# Patient Record
Sex: Male | Born: 1970 | Race: Black or African American | Hispanic: No | Marital: Married | Smoking: Never smoker
Health system: Southern US, Community
[De-identification: ages and names within clinical notes are randomized; demographics above are authoritative.]

## PROBLEM LIST (undated history)

## (undated) DIAGNOSIS — S86019A Strain of unspecified Achilles tendon, initial encounter: Secondary | ICD-10-CM

## (undated) HISTORY — PX: WISDOM TOOTH EXTRACTION: SHX21

## (undated) HISTORY — PX: FINGER AMPUTATION: SHX636

---

## 2004-08-18 ENCOUNTER — Encounter: Admission: RE | Admit: 2004-08-18 | Discharge: 2004-08-18 | Payer: Self-pay | Admitting: Otolaryngology

## 2006-11-21 ENCOUNTER — Encounter: Admission: RE | Admit: 2006-11-21 | Discharge: 2006-11-21 | Payer: Self-pay | Admitting: Emergency Medicine

## 2008-03-03 ENCOUNTER — Encounter: Admission: RE | Admit: 2008-03-03 | Discharge: 2008-03-03 | Payer: Self-pay | Admitting: Chiropractic Medicine

## 2009-12-09 ENCOUNTER — Ambulatory Visit: Payer: Self-pay | Admitting: Cardiology

## 2009-12-09 DIAGNOSIS — E669 Obesity, unspecified: Secondary | ICD-10-CM

## 2009-12-09 DIAGNOSIS — R9431 Abnormal electrocardiogram [ECG] [EKG]: Secondary | ICD-10-CM

## 2009-12-09 DIAGNOSIS — R002 Palpitations: Secondary | ICD-10-CM | POA: Insufficient documentation

## 2010-05-13 ENCOUNTER — Emergency Department (HOSPITAL_COMMUNITY): Admission: EM | Admit: 2010-05-13 | Discharge: 2010-05-13 | Payer: Self-pay | Admitting: Emergency Medicine

## 2010-10-29 LAB — CONVERTED CEMR LAB
BUN: 13 mg/dL (ref 6–23)
CO2: 31 meq/L (ref 19–32)
Calcium: 9.4 mg/dL (ref 8.4–10.5)
Chloride: 99 meq/L (ref 96–112)
Creatinine, Ser: 1.1 mg/dL (ref 0.4–1.5)
GFR calc non Af Amer: 95.51 mL/min (ref >60)
Glucose, Bld: 78 mg/dL (ref 70–99)
Potassium: 4.5 meq/L (ref 3.5–5.1)
Sodium: 136 meq/L (ref 135–145)
TSH: 1.13 microintl units/mL (ref 0.35–5.50)

## 2010-10-31 NOTE — Assessment & Plan Note (Signed)
Summary: per Dr Dallas Schimke ? percarditis   Visit Type:  Initial Consult Primary Provider:  Sander Nephew, MD  CC:  Abnormal EKG.  History of Present Illness: The patient presents for evaluation of chest discomfort and an abnormal EKG. He has no prior cardiac history. He recently started a vigorous exercise regimen. He was doing yoga yesterday and describes this as a vigorous activity. With this he had a vague sensation in his chest. He felt "jittery" but did not describe palpitations exactly. He didn't feel his heart racing. He felt mildly lightheaded. He felt slightly late he couldn't take a deep breath. He stopped working out in his symptoms seemed to persist though possibly not as severe. He is quite vague about his symptoms. He said today he felt and still feels a similar "jitteriness". He has had no prior cardiac history. Otherwise with recent aerobic activities he's had no symptoms. In particular he does not describe chest pressure, neck or arm discomfort. He hasn't had presyncope or syncope. He doesn't have any resting complaints such as PND or orthopnea. He doesn't have any fevers or chills. There is no positional discomfort.  He did present to urgent care yesterday and had an EKG which suggested some diffuse ST segment elevation. There were no old EKGs for comparison. He was referred here to exclude the possibility of pericarditis.   Current Medications (verified): 1)  Mucinex 600 Mg Xr12h-Tab (Guaifenesin) .... As Needed 2)  Fish Oil   Oil (Fish Oil) .Marland Kitchen.. 1 By Mouth Daily 3)  Multivitamins   Tabs (Multiple Vitamin) .Marland Kitchen.. 1 By Mouth Dialy 4)  Hydromet 5-1.5 Mg/72ml Syrp (Hydrocodone-Homatropine) .... 5ml Q 4-6 Hours 5)  Atrovent 0.06 % Soln (Ipratropium Bromide) .... As Needed 6)  Azithromycin 600 Mg Tabs (Azithromycin) .Marland Kitchen.. 1 By Mouth As Directed 7)  Ibuprofen 200 Mg Tabs (Ibuprofen) .... 3 By Mouth As Directed  Allergies (verified): No Known Drug Allergies  Past History:  Past Medical  History: None  Past Surgical History: None  Family History: There is no history of early heart disease, arrhythmias, cardiomyopathy or sudden cardiac death. His father has hypertension.  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 He is married. He has a 47 year old daughter. He has never smoked cigarettes and drinks alcohol rarely.  Review of Systems       Sore throat, decreased hearting right ear.  Otherwise as stated in the history of present illness and negative for all other systems.  Vital Signs:  Patient profile:   40 year old male Height:      64 inches Weight:      254 pounds BMI:     43.76 Pulse rate:   60 / minute Resp:     18 per minute BP sitting:   119 / 77  (left arm)  Vitals Entered By: Marrion Coy, CNA (December 09, 2009 11:35 AM)  Physical Exam  General:  Well developed, well nourished, in no acute distress. Head:  normocephalic and atraumatic Eyes:  PERRLA/EOM intact; conjunctiva and lids normal. Mouth:  Teeth, gums and palate normal. Oral mucosa normal. Neck:  Neck supple, no JVD. No masses, thyromegaly or abnormal cervical nodes. Chest Wall:  no deformities or breast masses noted Lungs:  Clear bilaterally to auscultation and percussion. Abdomen:  Bowel sounds positive; abdomen soft and non-tender without masses, organomegaly, or hernias noted. No hepatosplenomegaly, obese Msk:  Back normal, normal gait. Muscle strength and tone normal. Extremities:  No clubbing or cyanosis. Neurologic:  Alert and  oriented x 3. Skin:  Intact without lesions or rashes. Cervical Nodes:  no significant adenopathy Axillary Nodes:  no significant adenopathy Inguinal Nodes:  no significant adenopathy Psych:  Normal affect.   Detailed Cardiovascular Exam  Neck    Carotids: Carotids full and equal bilaterally without bruits.      Neck Veins: Normal, no JVD.    Heart    Inspection: no deformities or lifts noted.      Palpation: normal PMI with no thrills palpable.       Auscultation: regular rate and rhythm, S1, S2 without murmurs, rubs, gallops, or clicks.    Vascular    Abdominal Aorta: no palpable masses, pulsations, or audible bruits.      Femoral Pulses: normal femoral pulses bilaterally.      Pedal Pulses: normal pedal pulses bilaterally.      Radial Pulses: normal radial pulses bilaterally.      Peripheral Circulation: no clubbing, cyanosis, or edema noted with normal capillary refill.     EKG  Procedure date:  12/09/2009  Findings:      sinus rhythm, rate 56, axis within normal limits, intervals within normal limits, early repolarization changes.  Impression & Recommendations:  Problem # 1:  ABNORMAL ELECTROCARDIOGRAM (ICD-794.31) The patient has an EKG consistent with repolarization changes. He has no history to suggest pericarditis. There are no findings on physical exam to suggest this diagnosis. At this point I would suggest no further imaging or other evaluation except as described below. Orders: EKG w/ Interpretation (93000) TLB-BMP (Basic Metabolic Panel-BMET) (80048-METABOL) TLB-TSH (Thyroid Stimulating Hormone) (84443-TSH)  Problem # 2:  PALPITATIONS (ICD-785.1) His symptoms are somewhat vague. However, he seems to describe palpitations. However, he says he's feeling them even now with a normal EKG. I have encouraged him to go back to exercising. If he has any exacerbation of symptoms similar to yesterday I would proceed with event monitoring though I think this is somewhat low yield. I will go ahead today and check a TSH.  Problem # 3:  OBESITY, UNSPECIFIED (ICD-278.00) The patient has been exercising and I applaud this and encourage continued dieting and exercise.  Patient Instructions: 1)  Your physician recommends that you schedule a follow-up appointment as directed 2)  Your physician recommends that you continue on your current medications as directed. Please refer to the Current Medication list given to you today. 3)   Your physician recommends that you have lab work today: bmp and tsh  794.31

## 2010-12-15 LAB — POCT I-STAT, CHEM 8
BUN: 14 mg/dL (ref 6–23)
Calcium, Ion: 1.16 mmol/L (ref 1.12–1.32)
Chloride: 100 mEq/L (ref 96–112)
Creatinine, Ser: 1.4 mg/dL (ref 0.4–1.5)
Glucose, Bld: 97 mg/dL (ref 70–99)
HCT: 45 % (ref 39.0–52.0)
Hemoglobin: 15.3 g/dL (ref 13.0–17.0)
Potassium: 4.1 mEq/L (ref 3.5–5.1)
Sodium: 138 mEq/L (ref 135–145)
TCO2: 31 mmol/L (ref 0–100)

## 2011-04-05 ENCOUNTER — Encounter: Payer: Self-pay | Admitting: Cardiology

## 2012-04-17 ENCOUNTER — Ambulatory Visit (INDEPENDENT_AMBULATORY_CARE_PROVIDER_SITE_OTHER): Payer: BC Managed Care – PPO | Admitting: Emergency Medicine

## 2012-04-17 ENCOUNTER — Ambulatory Visit: Payer: BC Managed Care – PPO

## 2012-04-17 VITALS — BP 111/71 | HR 84 | Temp 99.2°F | Resp 18 | Ht 68.5 in | Wt 262.0 lb

## 2012-04-17 DIAGNOSIS — S335XXA Sprain of ligaments of lumbar spine, initial encounter: Secondary | ICD-10-CM

## 2012-04-17 DIAGNOSIS — E663 Overweight: Secondary | ICD-10-CM

## 2012-04-17 DIAGNOSIS — S39012A Strain of muscle, fascia and tendon of lower back, initial encounter: Secondary | ICD-10-CM

## 2012-04-17 DIAGNOSIS — M545 Low back pain: Secondary | ICD-10-CM

## 2012-04-17 NOTE — Progress Notes (Signed)
  Subjective:    Patient ID: Alan Sanchez, male    DOB: 09/03/1971, 41 y.o.   MRN: [id number]  HPI  Pt presents to clinic with chronic intermittent lumbar pain.  It started in high school without an injury and has continued.  Several years ago he had gained some extra weight and was experiencing increased pain so he loss some weight and went to chiropractor and felt better.  Then recently he started to experience the pain again.  It seems to be with walking and the distance he can walk without pain has decreased.  He rarely has pain with other movements and with sitting.  He has had no known injury.  He was in [place] last week and he had the worst of his pain.  He was evaluated and given muscle relaxer but only took 1 pill because it did not seem to help that much.  He has no pain radiation, weakness or paresthesias.  Pain feels tight.  Pt uses Aleve at times with some relief.  Review of Systems  Musculoskeletal: Positive for back pain. Negative for myalgias, joint swelling and gait problem.       Objective:   Physical Exam  Vitals reviewed. Constitutional: He is oriented to person, place, and time. He appears well-developed and well-nourished.  HENT:  Head: Normocephalic and atraumatic.  Right Ear: External ear normal.  Left Ear: External ear normal.  Nose: Nose normal.  Eyes: Conjunctivae are normal.  Neck: Normal range of motion.  Pulmonary/Chest: Effort normal.  Musculoskeletal: He exhibits no tenderness.       Lumbar back: He exhibits normal range of motion, no tenderness, no bony tenderness and no swelling.       Back:  Neurological: He is alert and oriented to person, place, and time.  Skin: Skin is warm and dry.  Psychiatric: He has a normal mood and affect. His behavior is normal. Judgment and thought content normal.   [facility] reading (PRIMARY) by  Dr. Cleta Alberts. Neg.     Assessment & Plan:   1. Lumbar back pain  DG Lumbar Spine 2-3 Views  2. Overweight     Believe his pain is  related to his lack of core muscles and strain.  D/w pt that weight loss and core exercises will help to decrease strain and imprpve support for lumbar spine.  He can continue to use Aleve OTC prn for his pain.  If pt is interested d/w him possible PT referral for back strengthening.  Sent pt home with lumbar exercises that focus on improving core strength.

## 2012-07-08 ENCOUNTER — Ambulatory Visit (INDEPENDENT_AMBULATORY_CARE_PROVIDER_SITE_OTHER): Payer: BC Managed Care – PPO | Admitting: Internal Medicine

## 2012-07-08 VITALS — BP 124/86 | HR 68 | Temp 98.1°F | Resp 18 | Ht 68.75 in | Wt 255.4 lb

## 2012-07-08 DIAGNOSIS — Z0000 Encounter for general adult medical examination without abnormal findings: Secondary | ICD-10-CM

## 2012-07-08 DIAGNOSIS — Z7189 Other specified counseling: Secondary | ICD-10-CM

## 2012-07-08 DIAGNOSIS — E663 Overweight: Secondary | ICD-10-CM

## 2012-07-08 DIAGNOSIS — Z23 Encounter for immunization: Secondary | ICD-10-CM

## 2012-07-08 LAB — POCT UA - MICROSCOPIC ONLY
Bacteria, U Microscopic: NEGATIVE
Casts, Ur, LPF, POC: NEGATIVE
Epithelial cells, urine per micros: NEGATIVE
Mucus, UA: NEGATIVE
RBC, urine, microscopic: NEGATIVE
WBC, Ur, HPF, POC: NEGATIVE
Yeast, UA: NEGATIVE

## 2012-07-08 LAB — POCT URINALYSIS DIPSTICK
Bilirubin, UA: NEGATIVE
Glucose, UA: NEGATIVE
Ketones, UA: NEGATIVE
Leukocytes, UA: NEGATIVE
Nitrite, UA: NEGATIVE
Protein, UA: NEGATIVE
Spec Grav, UA: 1.02
pH, UA: 5

## 2012-07-08 LAB — URIC ACID: Uric Acid, Serum: 6.2 mg/dL (ref 4.0–7.8)

## 2012-07-08 LAB — COMPREHENSIVE METABOLIC PANEL
ALT: 24 U/L (ref 0–53)
AST: 26 U/L (ref 0–37)
Albumin: 4.6 g/dL (ref 3.5–5.2)
BUN: 16 mg/dL (ref 6–23)
Calcium: 9.7 mg/dL (ref 8.4–10.5)
Chloride: 101 mEq/L (ref 96–112)
Creat: 1.35 mg/dL (ref 0.50–1.35)
Glucose, Bld: 79 mg/dL (ref 70–99)
Total Bilirubin: 1 mg/dL (ref 0.3–1.2)
Total Protein: 7.8 g/dL (ref 6.0–8.3)

## 2012-07-08 LAB — POCT CBC
Granulocyte percent: 54.6 %G (ref 37–80)
HCT, POC: 49.9 % (ref 43.5–53.7)
Hemoglobin: 16 g/dL (ref 14.1–18.1)
Lymph, poc: 2.3 (ref 0.6–3.4)
MID (cbc): 0.7 (ref 0–0.9)
MPV: 9.7 fL (ref 0–99.8)
POC Granulocyte: 3.6 (ref 2–6.9)
POC MID %: 9.9 %M (ref 0–12)
RBC: 5.76 M/uL (ref 4.69–6.13)
RDW, POC: 13.8 %
WBC: 6.6 10*3/uL (ref 4.6–10.2)

## 2012-07-08 LAB — LIPID PANEL
Cholesterol: 232 mg/dL — ABNORMAL HIGH (ref 0–200)
HDL: 49 mg/dL (ref >39)
LDL Cholesterol: 160 mg/dL — ABNORMAL HIGH (ref 0–99)
Total CHOL/HDL Ratio: 4.7 Ratio
VLDL: 23 mg/dL (ref 0–40)

## 2012-07-08 LAB — POCT GLYCOSYLATED HEMOGLOBIN (HGB A1C): Hemoglobin A1C: 5.4

## 2012-07-08 LAB — GLUCOSE, POCT (MANUAL RESULT ENTRY): POC Glucose: 78 mg/dl (ref 70–99)

## 2012-07-08 NOTE — Patient Instructions (Signed)

## 2012-07-08 NOTE — Progress Notes (Signed)
  Subjective:    Patient ID: Khyson Sebesta, male    DOB: 1970-11-16, 41 y.o.   MRN: [id number]  HPI Healthy but overweight See scanned hx   Review of Systems See scanned ros    Objective:   Physical Exam  Constitutional: He is oriented to person, place, and time. He appears well-developed and well-nourished.  HENT:  Right Ear: External ear normal.  Left Ear: External ear normal.  Nose: Nose normal.  Mouth/Throat: Oropharynx is clear and moist.  Eyes: EOM are normal. Pupils are equal, round, and reactive to light.  Neck: Normal range of motion. Neck supple. No thyromegaly present.  Cardiovascular: Normal rate, regular rhythm and normal heart sounds.   Pulmonary/Chest: Effort normal and breath sounds normal.  Abdominal: Soft. Bowel sounds are normal.  Genitourinary: Rectum normal, prostate normal and penis normal.  Musculoskeletal: Normal range of motion.  Lymphadenopathy:    He has no cervical adenopathy.  Neurological: He is alert and oriented to person, place, and time. He has normal reflexes. No cranial nerve deficit. He exhibits normal muscle tone. Coordination normal.  Skin: Skin is warm and dry.  Psychiatric: He has a normal mood and affect. His behavior is normal. Judgment and thought content normal.   Results for orders placed in visit on 07/08/12  POCT CBC      Component Value Range   WBC 6.6  4.6 - 10.2 K/uL   Lymph, poc 2.3  0.6 - 3.4   POC LYMPH PERCENT 35.5  10 - 50 %L   MID (cbc) 0.7  0 - 0.9   POC MID % 9.9  0 - 12 %M   POC Granulocyte 3.6  2 - 6.9   Granulocyte percent 54.6  37 - 80 %G   RBC 5.76  4.69 - 6.13 M/uL   Hemoglobin 16.0  14.1 - 18.1 g/dL   HCT, POC 78.2  95.6 - 53.7 %   MCV 86.7  80 - 97 fL   MCH, POC 27.8  27 - 31.2 pg   MCHC 32.1  31.8 - 35.4 g/dL   RDW, POC 21.3     Platelet Count, POC 267  142 - 424 K/uL   MPV 9.7  0 - 99.8 fL  POCT UA - MICROSCOPIC ONLY      Component Value Range   WBC, Ur, HPF, POC neg     RBC, urine, microscopic  neg     Bacteria, U Microscopic neg     Mucus, UA neg     Epithelial cells, urine per micros neg     Crystals, Ur, HPF, POC + uric acid crystals     Casts, Ur, LPF, POC neg     Yeast, UA neg    POCT URINALYSIS DIPSTICK      Component Value Range   Color, UA yellow     Clarity, UA clear     Glucose, UA neg     Bilirubin, UA neg     Ketones, UA neg     Spec Grav, UA 1.020     Blood, UA neg     pH, UA 5.0     Protein, UA neg     Urobilinogen, UA 0.2     Nitrite, UA neg     Leukocytes, UA Negative    IFOBT (OCCULT BLOOD)      Component Value Range   IFOBT Negative            Assessment & Plan:  Healthy CPE

## 2012-07-09 LAB — PSA: PSA: 0.95 ng/mL (ref <=4.00)

## 2012-07-11 ENCOUNTER — Encounter: Payer: Self-pay | Admitting: *Deleted

## 2012-07-23 ENCOUNTER — Encounter: Payer: Self-pay | Admitting: Internal Medicine

## 2013-08-08 ENCOUNTER — Ambulatory Visit (INDEPENDENT_AMBULATORY_CARE_PROVIDER_SITE_OTHER): Payer: BC Managed Care – PPO | Admitting: Family Medicine

## 2013-08-08 VITALS — BP 133/82 | HR 67 | Temp 98.3°F | Resp 18 | Ht 69.0 in | Wt 261.0 lb

## 2013-08-08 DIAGNOSIS — D509 Iron deficiency anemia, unspecified: Secondary | ICD-10-CM

## 2013-08-08 DIAGNOSIS — R42 Dizziness and giddiness: Secondary | ICD-10-CM

## 2013-08-08 DIAGNOSIS — K921 Melena: Secondary | ICD-10-CM

## 2013-08-08 LAB — POCT URINALYSIS DIPSTICK
Bilirubin, UA: NEGATIVE
Blood, UA: NEGATIVE
Glucose, UA: NEGATIVE
Ketones, UA: NEGATIVE
Leukocytes, UA: NEGATIVE
Spec Grav, UA: 1.02
Urobilinogen, UA: 0.2

## 2013-08-08 LAB — IFOBT (OCCULT BLOOD): IFOBT: NEGATIVE

## 2013-08-08 LAB — COMPREHENSIVE METABOLIC PANEL
ALT: 38 U/L (ref 0–53)
AST: 32 U/L (ref 0–37)
Albumin: 4.3 g/dL (ref 3.5–5.2)
Alkaline Phosphatase: 87 U/L (ref 39–117)
BUN: 19 mg/dL (ref 6–23)
CO2: 32 mEq/L (ref 19–32)
Glucose, Bld: 82 mg/dL (ref 70–99)
Sodium: 136 mEq/L (ref 135–145)
Total Bilirubin: 0.8 mg/dL (ref 0.3–1.2)
Total Protein: 7.5 g/dL (ref 6.0–8.3)

## 2013-08-08 LAB — POCT CBC
Granulocyte percent: 54.5 %G (ref 37–80)
HCT, POC: 46.2 % (ref 43.5–53.7)
Hemoglobin: 14.2 g/dL (ref 14.1–18.1)
Lymph, poc: 2.1 (ref 0.6–3.4)
MCH, POC: 27 pg (ref 27–31.2)
MCHC: 30.7 g/dL — AB (ref 31.8–35.4)
MCV: 88 fL (ref 80–97)
MID (cbc): 0.5 (ref 0–0.9)
MPV: 9.6 fL (ref 0–99.8)
POC LYMPH PERCENT: 37 %L (ref 10–50)
POC MID %: 8.5 %M (ref 0–12)
Platelet Count, POC: 238 10*3/uL (ref 142–424)
RDW, POC: 13 %
WBC: 5.8 10*3/uL (ref 4.6–10.2)

## 2013-08-08 LAB — TSH: TSH: 1.095 u[IU]/mL (ref 0.350–4.500)

## 2013-08-08 LAB — POCT GLYCOSYLATED HEMOGLOBIN (HGB A1C): Hemoglobin A1C: 5.3

## 2013-08-08 LAB — POCT UA - MICROSCOPIC ONLY
Bacteria, U Microscopic: NEGATIVE
Crystals, Ur, HPF, POC: NEGATIVE
Epithelial cells, urine per micros: NEGATIVE
Mucus, UA: NEGATIVE
RBC, urine, microscopic: NEGATIVE
Yeast, UA: NEGATIVE

## 2013-08-08 LAB — GLUCOSE, POCT (MANUAL RESULT ENTRY): POC Glucose: 63 mg/dl — AB (ref 70–99)

## 2013-08-08 LAB — POCT SEDIMENTATION RATE: POCT SED RATE: 13 mm/hr (ref 0–22)

## 2013-08-08 MED ORDER — CETIRIZINE HCL 10 MG PO TABS: 10.0000 mg | ORAL_TABLET | Freq: Every day | ORAL | Status: DC

## 2013-08-08 NOTE — Progress Notes (Addendum)
This chart was scribed for Alan Sorenson, MD by Alan Sanchez, Scribe. This patient was seen in room 1 and the patient's care was started at 11:48 AM.  Subjective:    Patient ID: Alan Sanchez, male    DOB: 1971-07-11, 42 y.o.   MRN: [id number]  Chief Complaint  Patient presents with  . Dizziness    x2 weeks, lightheadedness when standing today its worse     HPI  HPI Comments: Alan Sanchez is a 42 y.o. male who presents to [facility] complaining of feeling light-headed intermiittently over the past 2 weeks. He describes this sensation as "feeling off-balance". He denies vertigo-type sensation. He states that the light-headed sensation is occuring first thing in the morning when he stands up after getting out of bed. It only lasts for a few seconds, <1 min. However, it has not gone away over the past 2 wks and today the sensation stayed with him for the first time - he is still feeling a little bit of it now in the office if he moves or turns his head to quickly.   He also states that he noticed, upon wiping this morning, that his stool was dark black. He states that he has not previously noticed this symptom. He states that he has a history of anemia "a long time ago" and was never placed on any medications for this.  He denies chest pain or palpitations. He denies recent colds, sinus congestion, ear infections or any other recent illnesses. He denies nausea, emesis, abdominal pain, diarrhea, constipation, dysuria, hematuria, urgency and frequency.   Past Medical History  Diagnosis Date  . Anemia    Current Outpatient Prescriptions on File Prior to Visit  Medication Sig Dispense Refill  . Ascorbic Acid (VITAMIN C) 100 MG tablet Take 100 mg by mouth daily.      . fish oil-omega-3 fatty acids 1000 MG capsule Take 1 g by mouth daily.        . multivitamin (THERAGRAN) per tablet Take 1 tablet by mouth daily.        . vitamin B-12 (CYANOCOBALAMIN) 100 MCG tablet Take 50 mcg  by mouth daily.      . cyclobenzaprine (FLEXERIL) 10 MG tablet Take 10 mg by mouth 3 (three) times daily as needed.      Marland Kitchen ibuprofen (ADVIL,MOTRIN) 200 MG tablet Take 600 mg by mouth as directed.         No current facility-administered medications on file prior to visit.   Allergies  Allergen Reactions  . Mold Extract [Trichophyton]     Review of Systems  Constitutional: Negative for fever, chills, activity change and appetite change.  HENT: Negative for congestion, ear pain, postnasal drip, rhinorrhea, sinus pressure, sneezing, sore throat and trouble swallowing.   Respiratory: Negative for cough, shortness of breath and wheezing.   Cardiovascular: Negative for chest pain and palpitations.  Gastrointestinal: Positive for blood in stool (noticed "dark black" while wiping this morning). Negative for nausea, vomiting, abdominal pain, diarrhea and constipation.  Genitourinary: Negative for dysuria, urgency, frequency, hematuria and decreased urine volume.  Skin: Negative for rash.  Neurological: Positive for light-headedness. Negative for syncope, weakness, numbness and headaches.  Psychiatric/Behavioral: Negative for confusion and sleep disturbance.      BP 122/70  Pulse 69  Temp(Src) 98.3 F (36.8 C) (Oral)  Resp 18  Ht 5\' 9"  (1.753 m)  Wt 261 lb (118.389 kg)  BMI 38.53 kg/m2  SpO2 100% Objective:   Physical  Exam  Nursing note and vitals reviewed. Constitutional: He is oriented to person, place, and time. He appears well-developed and well-nourished. No distress.  HENT:  Head: Normocephalic and atraumatic.  Right Ear: Tympanic membrane is not injected, not erythematous, not retracted and not bulging. A middle ear effusion (mild) is present.  Left Ear: Tympanic membrane is not injected, not erythematous, not retracted and not bulging. A middle ear effusion (mild) is present.  Nose: Rhinorrhea present. No mucosal edema.  Mouth/Throat: Uvula is midline, oropharynx is clear and  moist and mucous membranes are normal. No oropharyngeal exudate, posterior oropharyngeal edema or posterior oropharyngeal erythema.  Eyes: EOM are normal.  Neck: Neck supple. No tracheal deviation present. No mass and no thyromegaly present.  Cardiovascular: Normal rate, regular rhythm and normal heart sounds.   No murmur heard. Pulmonary/Chest: Effort normal and breath sounds normal. No respiratory distress. He has no wheezes. He has no rales.  Genitourinary: Rectum normal and prostate normal. Rectal exam shows no external hemorrhoid, no internal hemorrhoid, no fissure, no mass, no tenderness and anal tone normal. Guaiac negative stool. Prostate is not enlarged and not tender.  Musculoskeletal: Normal range of motion.  Lymphadenopathy:       Head (right side): No submandibular, no tonsillar, no preauricular and no posterior auricular adenopathy present.       Head (left side): No submandibular, no tonsillar, no preauricular and no posterior auricular adenopathy present.    He has no cervical adenopathy.       Right cervical: No superficial cervical and no posterior cervical adenopathy present.      Left cervical: No posterior cervical adenopathy present.       Right: No supraclavicular adenopathy present.       Left: No supraclavicular adenopathy present.  Neurological: He is alert and oriented to person, place, and time.  Skin: Skin is warm and dry.  Psychiatric: He has a normal mood and affect. His behavior is normal.   EKG: NSR, no significant changes from prior [facility] negative.    Results for orders placed in visit on 08/08/13  POCT CBC      Result Value Range   WBC 5.8  4.6 - 10.2 K/uL   Lymph, poc 2.1  0.6 - 3.4   POC LYMPH PERCENT 37.0  10 - 50 %L   MID (cbc) 0.5  0 - 0.9   POC MID % 8.5  0 - 12 %M   POC Granulocyte 3.2  2 - 6.9   Granulocyte percent 54.5  37 - 80 %G   RBC 5.25  4.69 - 6.13 M/uL   Hemoglobin 14.2  14.1 - 18.1 g/dL   HCT, POC 16.1  09.6 - 53.7 %   MCV  88.0  80 - 97 fL   MCH, POC 27.0  27 - 31.2 pg   MCHC 30.7 (*) 31.8 - 35.4 g/dL   RDW, POC 04.5     Platelet Count, POC 238  142 - 424 K/uL   MPV 9.6  0 - 99.8 fL  GLUCOSE, POCT (MANUAL RESULT ENTRY)      Result Value Range   POC Glucose 63 (*) 70 - 99 mg/dl  IFOBT (OCCULT BLOOD)      Result Value Range   IFOBT Negative    POCT URINALYSIS DIPSTICK      Result Value Range   Color, UA yellow     Clarity, UA clear     Glucose, UA neg     Bilirubin, UA  neg     Ketones, UA neg     Spec Grav, UA 1.020     Blood, UA neg     pH, UA 5.5     Protein, UA neg     Urobilinogen, UA 0.2     Nitrite, UA neg     Leukocytes, UA Negative    POCT UA - MICROSCOPIC ONLY      Result Value Range   WBC, Ur, HPF, POC 0-1     RBC, urine, microscopic neg     Bacteria, U Microscopic neg     Mucus, UA neg     Epithelial cells, urine per micros neg     Crystals, Ur, HPF, POC neg     Casts, Ur, LPF, POC neg     Yeast, UA neg    POCT GLYCOSYLATED HEMOGLOBIN (HGB A1C)      Result Value Range   Hemoglobin A1C 5.3     Assessment & Plan:  Orthostatic lightheadedness - Plan: POCT CBC, POCT glucose (manual entry), IFOBT POC (occult bld, rslt in office), POCT SEDIMENTATION RATE, POCT urinalysis dipstick, POCT UA - Microscopic Only, Comprehensive metabolic panel, TSH, EKG 12-Lead, POCT glycosylated hemoglobin (Hb A1C)  Melena - Plan: POCT CBC, POCT glucose (manual entry), IFOBT POC (occult bld, rslt in office), POCT SEDIMENTATION RATE, POCT urinalysis dipstick, POCT UA - Microscopic Only, Comprehensive metabolic panel, TSH, EKG 12-Lead - reassuring negative exam today - cont to monitor stools.  Anemia, iron deficiency - Plan: POCT CBC, POCT glucose (manual entry), IFOBT POC (occult bld, rslt in office), POCT SEDIMENTATION RATE, POCT urinalysis dipstick, POCT UA - Microscopic Only, Comprehensive metabolic panel, TSH, EKG 12-Lead - Unknown etiology of this in distant past - seems to have completely  resolved.  Unknown etiology of sxs. See pt instructions - will try qhs cetirizine to ensure no complicating middle ear effusion or ETD as cause, have pt eat and drink something in morning to ensure no dehydration/hypoglycemia as cause.  Cont to monitor sxs closely - watch for daytime sxs if he lays downs/naps during the day. If continues, RTC for further eval. Meds ordered this encounter  Medications  . cetirizine (ZYRTEC) 10 MG tablet    Sig: Take 1 tablet (10 mg total) by mouth at bedtime.    Dispense:  30 tablet    Refill:  1    I personally performed the services described in this documentation, which was scribed in my presence. The recorded information has been reviewed and considered, and addended by me as needed.  Alan Sorenson, MD MPH

## 2013-08-08 NOTE — Patient Instructions (Addendum)
Please keep a snack - a banana and some crackers and some water - or maybe some orange juice or gatorade next to your bed. Try having a little something to eat and or drink before getting up in the morning.  Also, lets have you start taking the cetrizine right before bed to ensure you are not having any allergy symptoms.  Hopefully this will resolve with these easy measures while we are obtaining the rest of your labs.  If this continues or worsens and these measures do not help, please come back into clinic for repeat evaluation and we can consider whether you would benefit from specialist referral at that time.  Orthostatic Hypotension Orthostatic hypotension is a sudden fall in blood pressure. It occurs when a person goes from a sitting or lying position to a standing position. CAUSES   Loss of body fluids (dehydration).  Medicines that lower blood pressure.  Sudden changes in posture, such as sudden standing when you have been sitting or lying down.  Taking too much of your medicine. SYMPTOMS   Lightheadedness or dizziness.  Fainting or near-fainting.  A fast heart rate (tachycardia).  Weakness.  Feeling tired (fatigue). DIAGNOSIS  Your caregiver may find the cause of orthostatic hypotension through:  A history and/or physical exam.  Checking your blood pressure. Your caregiver will check your blood pressure when you are:  Lying down.  Sitting.  Standing.  Tilt table testing. In this test, you are placed on a table that goes from a lying position to a standing position. You will be strapped to the table. This test helps to monitor your blood pressure and heart rate when you are in different positions. TREATMENT   If orthostatic hypotension is caused by your medicines, your caregiver will need to adjust your dosage. Do not stop or adjust your medicine on your own.  When changing positions, make these changes slowly. This allows your body to adjust to the different  position.  Compression stockings that are worn on your lower legs may be helpful.  Your caregiver may have you consume extra salt. Do not add extra salt to your diet unless directed by your caregiver.  Eat frequent, small meals. Avoid sudden standing after eating.  Avoid hot showers or excessive heat.  Your caregiver may give you fluids through the vein (intravenous).  Your caregiver may put you on medicine to help enhance fluid retention. SEEK IMMEDIATE MEDICAL CARE IF:   You faint or have a near-fainting episode. Call your local emergency services (911 in [place]  You have or develop chest pain.  You feel sick to your stomach (nauseous) or vomit.  You have a loss of feeling or movement in your arms or legs.  You have difficulty talking, slurred speech, or you are unable to talk.  You have difficulty thinking or have confused thinking. MAKE SURE YOU:   Understand these instructions.  Will watch your condition.  Will get help right away if you are not doing well or get worse. Document Released: 09/07/2002 Document Revised: 12/10/2011 Document Reviewed: 12/31/2008 [facility] Patient Information 2014 [place], [place]. Near-Syncope Near-syncope (commonly known as near fainting) is sudden weakness, dizziness, or feeling like you might pass out. During an episode of near-syncope, you may also develop pale skin, have tunnel vision, or feel sick to your stomach (nauseous). Near-syncope may occur when getting up after sitting or while standing for a long time. It is caused by a sudden decrease in blood flow to the brain. This decrease  can result from various causes or triggers, most of which are not serious. However, because near-syncope can sometimes be a sign of something serious, a medical evaluation is required. The specific cause is often not determined. HOME CARE INSTRUCTIONS  Monitor your condition for any changes. The following actions may help to alleviate any discomfort you are  experiencing:  Have someone stay with you until you feel stable.  Lie down right away if you start feeling like you might faint. Breathe deeply and steadily. Wait until all the symptoms have passed. Most of these episodes last only a few minutes. You may feel tired for several hours.   Drink enough fluids to keep your urine clear or pale yellow.   If you are taking blood pressure or heart medicine, get up slowly when seated or lying down. Take several minutes to sit and then stand. This can reduce dizziness.  Follow up with your health care provider as directed. SEEK IMMEDIATE MEDICAL CARE IF:   You have a severe headache.   You have unusual pain in the chest, abdomen, or back.   You are bleeding from the mouth or rectum, or you have black or tarry stool.   You have an irregular or very fast heartbeat.   You have repeated fainting or have seizure-like jerking during an episode.   You faint when sitting or lying down.   You have confusion.   You have difficulty walking.   You have severe weakness.   You have vision problems.  MAKE SURE YOU:   Understand these instructions.  Will watch your condition.  Will get help right away if you are not doing well or get worse. Document Released: 09/17/2005 Document Revised: 05/20/2013 Document Reviewed: 02/20/2013 [facility] Patient Information 2014 [place], [place].

## 2013-08-10 ENCOUNTER — Encounter: Payer: Self-pay | Admitting: Family Medicine

## 2013-08-11 ENCOUNTER — Encounter: Payer: Self-pay | Admitting: Family Medicine

## 2013-11-15 ENCOUNTER — Other Ambulatory Visit: Payer: Self-pay | Admitting: Emergency Medicine

## 2013-11-15 ENCOUNTER — Ambulatory Visit: Payer: BC Managed Care – PPO

## 2013-11-15 ENCOUNTER — Ambulatory Visit (INDEPENDENT_AMBULATORY_CARE_PROVIDER_SITE_OTHER): Payer: BC Managed Care – PPO | Admitting: Emergency Medicine

## 2013-11-15 VITALS — BP 120/84 | HR 73 | Temp 98.9°F | Resp 16 | Ht 68.5 in | Wt 263.5 lb

## 2013-11-15 DIAGNOSIS — M79641 Pain in right hand: Secondary | ICD-10-CM

## 2013-11-15 DIAGNOSIS — M79609 Pain in unspecified limb: Secondary | ICD-10-CM

## 2013-11-15 DIAGNOSIS — S60229A Contusion of unspecified hand, initial encounter: Secondary | ICD-10-CM

## 2013-11-15 MED ORDER — NAPROXEN SODIUM 550 MG PO TABS: 550.0000 mg | ORAL_TABLET | Freq: Two times a day (BID) | ORAL | Status: DC

## 2013-11-15 NOTE — Progress Notes (Signed)
[facility] [address] [place] [address] [phone]  Date:  11/15/2013   Name:  Alan Sanchez   DOB:  [birth year]   MRN:  [id number]  PCP:  No PCP Per Patient    Chief Complaint: Hand Pain   History of Present Illness:  Alan Sanchez is a 43 y.o. very pleasant male patient who presents with the following:  Injured Monday working out while doing "squat thrusts" came down on his right closed fist and felt immediate pain in his right third MCP joint.  No swelling, crepitus or bruising.  No improvement with over the counter medications or other home remedies. Denies other complaint or health concern today.   Patient Active Problem List   Diagnosis Date Noted  . OBESITY, UNSPECIFIED 12/09/2009  . PALPITATIONS 12/09/2009  . ABNORMAL ELECTROCARDIOGRAM 12/09/2009    Past Medical History  Diagnosis Date  . Anemia     History reviewed. No pertinent past surgical history.  History  Substance Use Topics  . Smoking status: Never Smoker   . Smokeless tobacco: Not on file  . Alcohol Use: Yes     Comment: Rarely    Family History  Problem Relation Age of Onset  . Hypertension Father   . Arrhythmia Neg Hx   . Heart disease Neg Hx     No early  . Cardiomyopathy Neg Hx     Nor sudden cardiac death  . Hypertension Brother     Allergies  Allergen Reactions  . Mold Extract [Trichophyton]     Medication list has been reviewed and updated.  Current Outpatient Prescriptions on File Prior to Visit  Medication Sig Dispense Refill  . Ascorbic Acid (VITAMIN C) 100 MG tablet Take 100 mg by mouth daily.      . cetirizine (ZYRTEC) 10 MG tablet Take 1 tablet (10 mg total) by mouth at bedtime.  30 tablet  1  . cyclobenzaprine (FLEXERIL) 10 MG tablet Take 10 mg by mouth 3 (three) times daily as needed.      . fish oil-omega-3 fatty acids 1000 MG capsule Take 1 g by mouth daily.        Marland Kitchen ibuprofen (ADVIL,MOTRIN) 200 MG tablet Take 600 mg by mouth as  directed.        . multivitamin (THERAGRAN) per tablet Take 1 tablet by mouth daily.        . vitamin B-12 (CYANOCOBALAMIN) 100 MCG tablet Take 50 mcg by mouth daily.       No current facility-administered medications on file prior to visit.    Review of Systems:  As per HPI, otherwise negative.    Physical Examination: Filed Vitals:   11/15/13 1354  BP: 120/84  Pulse: 73  Temp: 98.9 F (37.2 C)  Resp: 16   Filed Vitals:   11/15/13 1354  Height: 5' 8.5" (1.74 m)  Weight: 263 lb 8 oz (119.523 kg)   Body mass index is 39.48 kg/(m^2). Ideal Body Weight: Weight in (lb) to have BMI = 25: 166.5   GEN: WDWN, NAD, Non-toxic, Alert & Oriented x 3 HEENT: Atraumatic, Normocephalic.  Ears and Nose: No external deformity. EXTR: No clubbing/cyanosis/edema NEURO: Normal gait.  PSYCH: Normally interactive. Conversant. Not depressed or anxious appearing.  Calm demeanor.  Right Hand:  Tender over third finger MCP.  No deformity or ecchymosis.  Full AROM.    Assessment and Plan: Contusion hand Anaprox  Signed,  Phillips Odor, MD   [facility] reading (PRIMARY) by  Dr. Dareen PianoAnderson.  Negative hand .

## 2013-11-15 NOTE — Patient Instructions (Signed)
Hand Contusion °A hand contusion is a deep bruise on your hand area. Contusions are the result of an injury that caused bleeding under the skin. The contusion may turn blue, purple, or yellow. Minor injuries will give you a painless contusion, but more severe contusions may stay painful and swollen for a few weeks. °CAUSES  °A contusion is usually caused by a blow, trauma, or direct force to an area of the body. °SYMPTOMS  °· Swelling and redness of the injured area. °· Discoloration of the injured area. °· Tenderness and soreness of the injured area. °· Pain. °DIAGNOSIS  °The diagnosis can be made by taking a history and performing a physical exam. An X-ray, CT scan, or MRI may be needed to determine if there were any associated injuries, such as broken bones (fractures). °TREATMENT  °Often, the best treatment for a hand contusion is resting, elevating, icing, and applying cold compresses to the injured area. Over-the-counter medicines may also be recommended for pain control. °HOME CARE INSTRUCTIONS  °· Put ice on the injured area. °· Put ice in a plastic bag. °· Place a towel between your skin and the bag. °· Leave the ice on for 15-20 minutes, 03-04 times a day. °· Only take over-the-counter or prescription medicines as directed by your caregiver. Your caregiver may recommend avoiding anti-inflammatory medicines (aspirin, ibuprofen, and naproxen) for 48 hours because these medicines may increase bruising. °· If told, use an elastic wrap as directed. This can help reduce swelling. You may remove the wrap for sleeping, showering, and bathing. If your fingers become numb, cold, or blue, take the wrap off and reapply it more loosely. °· Elevate your hand with pillows to reduce swelling. °· Avoid overusing your hand if it is painful. °SEEK IMMEDIATE MEDICAL CARE IF:  °· You have increased redness, swelling, or pain in your hand. °· Your swelling or pain is not relieved with medicines. °· You have loss of feeling in  your hand or are unable to move your fingers. °· Your hand turns cold or blue. °· You have pain when you move your fingers. °· Your hand becomes warm to the touch. °· Your contusion does not improve in 2 days. °MAKE SURE YOU:  °· Understand these instructions. °· Will watch your condition. °· Will get help right away if you are not doing well or get worse. °Document Released: 03/09/2002 Document Revised: 06/11/2012 Document Reviewed: 03/10/2012 °[employer]® Patient Information ©2014 [employer]. ° °

## 2014-03-29 ENCOUNTER — Ambulatory Visit (INDEPENDENT_AMBULATORY_CARE_PROVIDER_SITE_OTHER): Payer: BC Managed Care – PPO | Admitting: Emergency Medicine

## 2014-03-29 VITALS — BP 118/80 | HR 66 | Temp 98.3°F | Resp 16

## 2014-03-29 DIAGNOSIS — M7552 Bursitis of left shoulder: Secondary | ICD-10-CM

## 2014-03-29 DIAGNOSIS — M67919 Unspecified disorder of synovium and tendon, unspecified shoulder: Secondary | ICD-10-CM

## 2014-03-29 DIAGNOSIS — M719 Bursopathy, unspecified: Secondary | ICD-10-CM

## 2014-03-29 MED ORDER — NAPROXEN SODIUM 550 MG PO TABS: 550.0000 mg | ORAL_TABLET | Freq: Two times a day (BID) | ORAL | Status: DC

## 2014-03-29 NOTE — Progress Notes (Signed)
[facility] [address], [place] [id number] [phone]  Date:  03/29/2014   Name:  Alan GlenChristopher Sanchez   DOB:  11/26/1970   MRN:  [id number]  PCP:  No PCP Per Patient    Chief Complaint: Shoulder Pain   History of Present Illness:  Alan Sanchez is a 43 y.o. very pleasant male patient who presents with the following:  Has been working out recently, doing pushups and running.  Has pain in shoulders worse in left side.  Present since May.  Worse when exercises. Pain in lateral shoulder joint line.  No history of injury.  No improvement with over the counter medications or other home remedies. Denies other complaint or health concern today.   Patient Active Problem List   Diagnosis Date Noted  . OBESITY, UNSPECIFIED 12/09/2009  . PALPITATIONS 12/09/2009  . ABNORMAL ELECTROCARDIOGRAM 12/09/2009    Past Medical History  Diagnosis Date  . Anemia     History reviewed. No pertinent past surgical history.  History  Substance Use Topics  . Smoking status: Never Smoker   . Smokeless tobacco: Not on file  . Alcohol Use: Yes     Comment: Rarely    Family History  Problem Relation Age of Onset  . Hypertension Father   . Arrhythmia Neg Hx   . Heart disease Neg Hx     No early  . Cardiomyopathy Neg Hx     Nor sudden cardiac death  . Hypertension Brother   . Diabetes Mother     Allergies  Allergen Reactions  . Mold Extract [Trichophyton]     Medication list has been reviewed and updated.  Current Outpatient Prescriptions on File Prior to Visit  Medication Sig Dispense Refill  . multivitamin (THERAGRAN) per tablet Take 1 tablet by mouth daily.        . Ascorbic Acid (VITAMIN C) 100 MG tablet Take 100 mg by mouth daily.      . cetirizine (ZYRTEC) 10 MG tablet Take 1 tablet (10 mg total) by mouth at bedtime.  30 tablet  1  . cyclobenzaprine (FLEXERIL) 10 MG tablet Take 10 mg by mouth 3 (three) times daily as needed.      . fish oil-omega-3 fatty  acids 1000 MG capsule Take 1 g by mouth daily.        Marland Kitchen. ibuprofen (ADVIL,MOTRIN) 200 MG tablet Take 600 mg by mouth as directed.        . naproxen sodium (ANAPROX DS) 550 MG tablet Take 1 tablet (550 mg total) by mouth 2 (two) times daily with a meal.  40 tablet  0  . vitamin B-12 (CYANOCOBALAMIN) 100 MCG tablet Take 50 mcg by mouth daily.       No current facility-administered medications on file prior to visit.    Review of Systems:  As per HPI, otherwise negative.    Physical Examination: Filed Vitals:   03/29/14 1337  BP: 118/80  Pulse: 66  Temp: 98.3 F (36.8 C)  Resp: 16   There were no vitals filed for this visit. There is no weight on file to calculate BMI. Ideal Body Weight:     GEN: WDWN, NAD, Non-toxic, Alert & Oriented x 3 HEENT: Atraumatic, Normocephalic.  Ears and Nose: No external deformity. EXTR: No clubbing/cyanosis/edema NEURO: Normal gait.  PSYCH: Normally interactive. Conversant. Not depressed or anxious appearing.  Calm demeanor.  LEFT shoulder:  Anterior shoulder tenderness.  Full ROM  Assessment and Plan: Left shoulder bursitis  Anaprox   Signed,  Phillips OdorJeffery Anderson, MD

## 2014-03-29 NOTE — Patient Instructions (Signed)

## 2014-04-23 ENCOUNTER — Ambulatory Visit (INDEPENDENT_AMBULATORY_CARE_PROVIDER_SITE_OTHER): Payer: BC Managed Care – PPO | Admitting: Family Medicine

## 2014-04-23 ENCOUNTER — Encounter: Payer: Self-pay | Admitting: Family Medicine

## 2014-04-23 VITALS — BP 127/85 | HR 77 | Ht 69.0 in | Wt 245.0 lb

## 2014-04-23 DIAGNOSIS — M25562 Pain in left knee: Principal | ICD-10-CM

## 2014-04-23 DIAGNOSIS — M25521 Pain in right elbow: Secondary | ICD-10-CM

## 2014-04-23 DIAGNOSIS — M25512 Pain in left shoulder: Secondary | ICD-10-CM | POA: Insufficient documentation

## 2014-04-23 DIAGNOSIS — M25529 Pain in unspecified elbow: Secondary | ICD-10-CM

## 2014-04-23 DIAGNOSIS — M25519 Pain in unspecified shoulder: Secondary | ICD-10-CM

## 2014-04-23 DIAGNOSIS — M25569 Pain in unspecified knee: Secondary | ICD-10-CM

## 2014-04-23 DIAGNOSIS — M25561 Pain in right knee: Secondary | ICD-10-CM

## 2014-04-23 NOTE — Assessment & Plan Note (Signed)
Normal exam today. Likely exacerbated by obesity. Advised continued diet and exercise.

## 2014-04-23 NOTE — Assessment & Plan Note (Signed)
No signs of rotator cuff pathology or impingement on physical exam. Patient reassured today. Advised augmentation of exercise regimen and PRN naproxen if needed for pain.

## 2014-04-23 NOTE — Progress Notes (Signed)
  Alan Sanchez - 43 y.o. male MRN [id number]  Date of birth: 02/28/1971    SUBJECTIVE:     43 year old obese male presents as a new patient with complaints of left shoulder pain.  Patient also reports bilateral knee pain and occasional right elbow pain.  Left shoulder pain - Has been present for approximately one month - Patient has difficulty localizing area pain - Pain is been exacerbated by activity - pushups, up/downs - He denies any associated weakness or decreased ROM - He has seen his PCP for this and way prescribed Naproxen which he takes only occasionally. - Patient does describe periods of time where he feels like his shoulder is going to pop out of socket. He's never had a prior dislocation.  Bilateral Knee Pain - Pain is located primarily over the distal portion of the patella/patellar tendon. - Pain is intermittent and occasional.  This has not bothered him enough to seek treatment or take naproxen that was previous he prescribed. - No recent fall, trauma, injury.  No reports of knee instability.  R elbow pain - Is occasional - Pain is located at the olecranon - No exacerbating or relieving factors. - No weakness or decreased ROM. - Is not impacting activities.  ROS:     Per HPI  PERTINENT  PMH / PSH FH / / SH:  Past Medical, Surgical, Social, and Family History Reviewed & Updated per EMR.  Pertinent Historical Findings include: Prior recent office visit with shoulder pain.  Was diagnosed with bursitis.  OBJECTIVE: BP 127/85  Pulse 77  Ht 5\' 9"  (1.753 m)  Wt 245 lb (111.131 kg)  BMI 36.16 kg/m2  Physical Exam:  Vital signs are reviewed. General: well appearing obese male in NAD. Shoulder: Inspection reveals no abnormalities, atrophy or asymmetry. Palpation is normal with no tenderness over AC joint or bicipital groove. ROM is full in all planes. No instability noted. Rotator cuff strength normal throughout. No signs of impingement with negative Neer and  Hawkin's tests, empty can. No painful arc and no drop arm sign.  Knee: Normal to inspection with no erythema or effusion or obvious bony abnormalities. Palpation normal with no warmth, joint line tenderness, patellar tenderness, or condyle tenderness. ROM full in flexion and extension and lower leg rotation. Ligaments with solid consistent endpoints including ACL, PCL, LCL, MCL. Negative Mcmurray's. Patellar glide without crepitus. Patellar and quadriceps tendons unremarkable. Hamstring and quadriceps strength is normal.   Elbow: Unremarkable to inspection. Range of motion full pronation, supination, flexion, extension. Strength is full to all of the above directions Stable to varus, valgus stress.  No discrete areas of tenderness to palpation.  ASSESSMENT & PLAN: See problem based charting & AVS for pt instructions.

## 2014-04-23 NOTE — Assessment & Plan Note (Signed)
Unclear etiology. No significant findings on physical exam. Advised PRN NSAID's and follow up if worsens.

## 2014-04-23 NOTE — Patient Instructions (Signed)
It was nice to see you.  Your shoulder pain is mild and is improving.  Physical exam is normal and not concerning.  You can resume your normal activities.  He continues naproxen or ibuprofen as needed for pain.  Regarding her knees, her physical exam is normal.  Some of the pain may be attributed to your weight.  Continue diet and exercise.  Regarding your elbow, her physical exam is also normal. Followup with us if you have any additional concerns or worsening pain.

## 2014-04-26 NOTE — Progress Notes (Signed)
Patient ID: Alan Sanchez, male   DOB: 03/07/1971, 43 y.o.   MRN: [id number] [facility] Attending Note: I have seen and examined this patient. I have discussed this patient with the resident and reviewed the assessment and plan as documented above. I agree with the resident's findings and plan. After discussion with patient, I think he was most concerned about whether or not he was injuring himself if he continue to try various exercise programs. We talked about safe ways to advance exercise as and no more than 10% per week. He see much reassured.

## 2014-12-12 ENCOUNTER — Ambulatory Visit (INDEPENDENT_AMBULATORY_CARE_PROVIDER_SITE_OTHER): Payer: BC Managed Care – PPO | Admitting: Family Medicine

## 2014-12-12 VITALS — BP 110/72 | HR 89 | Temp 98.0°F | Resp 20 | Ht 68.75 in | Wt 266.0 lb

## 2014-12-12 DIAGNOSIS — M545 Low back pain, unspecified: Secondary | ICD-10-CM

## 2014-12-12 DIAGNOSIS — M5489 Other dorsalgia: Secondary | ICD-10-CM

## 2014-12-12 DIAGNOSIS — S46912A Strain of unspecified muscle, fascia and tendon at shoulder and upper arm level, left arm, initial encounter: Secondary | ICD-10-CM | POA: Diagnosis not present

## 2014-12-12 MED ORDER — PREDNISONE 20 MG PO TABS: 40.0000 mg | ORAL_TABLET | Freq: Every day | ORAL | Status: DC

## 2014-12-12 NOTE — Progress Notes (Signed)
This chart was scribed for Elvina Sidle, MD by Luisa Dago, Medical Scribe. This patient was seen in room 8 and the patient's care was started at 12:34 PM.  Subjective:    Patient ID: Alan Sanchez, male    DOB: 12/31/1970, 44 y.o.   MRN: [id number]  Chief Complaint  Patient presents with   Back Pain    Lower   Shoulder Pain    Left    HPI Alan Sanchez is a 44 y.o. 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 He is also complaining of lower back pain. Pt states that secondary to the pain he has not been able to bench press or lift any weights. He is unsure of what could have caused his back pain but he is suspicious that the heavy lifting may have caused his shoulder pain.  He states that the pain is exacerbated by bending, lifting, or trying to sit in his car.  Pt has been seen here in the office for muscle pain in the past and was sent home with a referral to ortho and muscle relaxant. He states that he has not been able to follow up with the ortho specialist. However, he denies any fever, neck pain, sore throat, visual disturbance, CP, cough, SOB, abdominal pain, nausea, emesis, diarrhea, urinary symptoms, back pain, HA, weakness, numbness and rash as associated symptoms.    Current Outpatient Prescriptions on File Prior to Visit  Medication Sig Dispense Refill   Ascorbic Acid (VITAMIN C) 100 MG tablet Take 100 mg by mouth daily.     fish oil-omega-3 fatty acids 1000 MG capsule Take 1 g by mouth daily.       No current facility-administered medications on file prior to visit.   Review of Systems  Constitutional: Negative for fever, fatigue and unexpected weight change.  Eyes: Negative for visual disturbance.  Respiratory: Negative for cough, chest tightness and shortness of breath.   Cardiovascular: Negative for chest pain, palpitations and leg swelling.    Gastrointestinal: Negative for abdominal pain and blood in stool.  Musculoskeletal: Positive for arthralgias. Negative for myalgias and joint swelling.  Neurological: Negative for dizziness, weakness, light-headedness, numbness and headaches.   Objective:   Physical Exam  Vitals reviewed. BP 110/72 mmHg   Pulse 89   Temp(Src) 98 F (36.7 C) (Oral)   Resp 20   Ht 5' 8.75" (1.746 m)   Wt 266 lb (120.657 kg)   BMI 39.58 kg/m2   SpO2 99%  General Appearance:    Alert, cooperative, no distress, appears stated age  Head:    Normocephalic, without obvious abnormality, atraumatic  Eyes:    PERRL, conjunctiva/corneas clear, EOM's intact, fundi    benign, both eyes       Ears:    Normal TM's and external ear canals, both ears  Nose:   Nares normal, septum midline, mucosa normal, no drainage   or sinus tenderness  Throat:   Lips, mucosa, and tongue normal; teeth and gums normal  Neck:   Supple, symmetrical, trachea midline, no adenopathy;       thyroid:  No enlargement/tenderness/nodules; no carotid   bruit or JVD  Back:     Symmetric, no curvature, ROM normal, no CVA tenderness  Lungs:     Clear to auscultation bilaterally, respirations unlabored  Chest wall:    No tenderness or deformity  Heart:    Regular rate  and rhythm, S1 and S2 normal, no murmur, rub   or gallop  Abdomen:     Soft, non-tender, bowel sounds active all four quadrants,    no masses, no organomegaly  Genitalia:    Normal male without lesion, discharge or tenderness  Rectal:    Normal tone, normal prostate, no masses or tenderness;   guaiac negative stool  Extremities:   Extremities normal, atraumatic, no cyanosis or edema, pain when abducting the left shoulder; negative straight leg raises.  Pulses:   2+ and symmetric all extremities  Skin:   Skin color, texture, turgor normal, no rashes or lesions  Lymph nodes:   Cervical, supraclavicular, and axillary nodes normal  Neurologic:   CNII-XII intact. Normal strength,  sensation and reflexes      throughout   Assessment & Plan:   This chart was scribed in my presence and reviewed by me personally.    ICD-9-CM ICD-10-CM   1. Shoulder strain, left, initial encounter 840.9 S46.912A predniSONE (DELTASONE) 20 MG tablet  2. Lumbosacral pain 724.2 M54.5 predniSONE (DELTASONE) 20 MG tablet   724.6 M54.89      Signed, Elvina SidleKurt Lauenstein, MD

## 2014-12-19 ENCOUNTER — Ambulatory Visit (INDEPENDENT_AMBULATORY_CARE_PROVIDER_SITE_OTHER): Payer: BC Managed Care – PPO | Admitting: Emergency Medicine

## 2014-12-19 ENCOUNTER — Ambulatory Visit (INDEPENDENT_AMBULATORY_CARE_PROVIDER_SITE_OTHER): Payer: BC Managed Care – PPO

## 2014-12-19 VITALS — BP 110/78 | HR 71 | Temp 98.3°F | Resp 18 | Ht 69.5 in | Wt 270.4 lb

## 2014-12-19 DIAGNOSIS — M2392 Unspecified internal derangement of left knee: Secondary | ICD-10-CM

## 2014-12-19 MED ORDER — HYDROCODONE-ACETAMINOPHEN 5-325 MG PO TABS: 1.0000 | ORAL_TABLET | ORAL | Status: DC | PRN

## 2014-12-19 MED ORDER — NAPROXEN SODIUM 550 MG PO TABS: 550.0000 mg | ORAL_TABLET | Freq: Two times a day (BID) | ORAL | Status: DC

## 2014-12-19 NOTE — Progress Notes (Signed)
[facility] [address], [place] [address] [phone]  Date:  12/19/2014   Name:  Alan Sanchez   DOB:  12/02/1970   MRN:  [id number]  PCP:  No PCP Per Patient    Chief Complaint: Fall and Knee Pain   History of Present Illness:  Alan Sanchez is a 44 y.o. very pleasant male patient who presents with the following:  Injured his left knee when he tripped and fell yesterday and suffered a hyperflexion injury. Not able to bear weight and is using crutches No swelling but limited motion Less pain with rest and sitting. No improvement with over the counter medications or other home remedies.  Denies other complaint or health concern today.   Patient Active Problem List   Diagnosis Date Noted  . Bilateral knee pain 04/23/2014  . Right elbow pain 04/23/2014  . Left shoulder pain 04/23/2014  . OBESITY, UNSPECIFIED 12/09/2009  . PALPITATIONS 12/09/2009  . ABNORMAL ELECTROCARDIOGRAM 12/09/2009    Past Medical History  Diagnosis Date  . Anemia     Past Surgical History  Procedure Laterality Date  . Finger amputation      Tip of Right middle finger    History  Substance Use Topics  . Smoking status: Never Smoker   . Smokeless tobacco: Never Used  . Alcohol Use: 0.0 oz/week    0 Standard drinks or equivalent per week     Comment: Rarely    Family History  Problem Relation Age of Onset  . Hypertension Father   . Arrhythmia Neg Hx   . Heart disease Neg Hx     No early  . Cardiomyopathy Neg Hx     Nor sudden cardiac death  . Hypertension Brother   . Diabetes Mother     Allergies  Allergen Reactions  . Mold Extract [Trichophyton]     Medication list has been reviewed and updated.  Current Outpatient Prescriptions on File Prior to Visit  Medication Sig Dispense Refill  . Ascorbic Acid (VITAMIN C) 100 MG tablet Take 100 mg by mouth daily.    . fish oil-omega-3 fatty acids 1000 MG capsule Take 1 g by mouth daily.      .  predniSONE (DELTASONE) 20 MG tablet Take 2 tablets (40 mg total) by mouth daily. 10 tablet 1   No current facility-administered medications on file prior to visit.    Review of Systems:  As per HPI, otherwise negative.    Physical Examination: Filed Vitals:   12/19/14 1244  BP: 110/78  Pulse: 71  Temp: 98.3 F (36.8 C)  Resp: 18   Filed Vitals:   12/19/14 1244  Height: 5' 9.5" (1.765 m)  Weight: 270 lb 6.4 oz (122.653 kg)   Body mass index is 39.37 kg/(m^2). Ideal Body Weight: Weight in (lb) to have BMI = 25: 171.4   GEN: WDWN, NAD, Non-toxic, Alert & Oriented x 3 HEENT: Atraumatic, Normocephalic.  Ears and Nose: No external deformity. EXTR: No clubbing/cyanosis/edema NEURO: Normal gait.  PSYCH: Normally interactive. Conversant. Not depressed or anxious appearing.  Calm demeanor.  LEFT knee:  No effusion.  Tender.  Guards full extension and flexion.  Joint stable   Assessment and Plan: Knee sprain Anaprox vicodin  Signed,  Phillips OdorJeffery Shad Ledvina, MD   [facility] reading (PRIMARY) by  Dr. Dareen PianoAnderson.  negative.

## 2014-12-19 NOTE — Patient Instructions (Signed)

## 2015-01-16 ENCOUNTER — Ambulatory Visit (INDEPENDENT_AMBULATORY_CARE_PROVIDER_SITE_OTHER): Payer: BC Managed Care – PPO | Admitting: Internal Medicine

## 2015-01-16 VITALS — BP 110/62 | HR 82 | Temp 98.1°F | Resp 16 | Ht 68.75 in | Wt 268.0 lb

## 2015-01-16 DIAGNOSIS — M2392 Unspecified internal derangement of left knee: Secondary | ICD-10-CM

## 2015-01-16 NOTE — Addendum Note (Signed)
Addended by: Tonye PearsonOLITTLE, ROBERT P on: 01/16/2015 04:33 PM   Modules accepted: Orders

## 2015-01-16 NOTE — Progress Notes (Signed)
Subjective:  This chart was scribed for Alan Sia MD, by Veverly Fells, at [facility].  This patient was seen in room 3 and the patient's care was started at 3:03 PM.   Chief Complaint  Patient presents with  . Follow-up    left knee injury     Patient ID: Alan Sanchez, male    DOB: [birth year], 44 y.o.   MRN: [id number]  HPI  HPI Comments: Alan Sanchez is a 44 y.o. male who [facility] with intermittent knee pain onset 20 days ago.  He injured his knee when he when he fell in order to avoid hitting his head.  He was evaluated here by Dr. Dareen Piano and started on his medication (Anaprox and Hydrocodone)- his pain had decreased but then for the past 11 days, the pain got progressively worse.  Because the pain worsened, he started on the medication again but it is no longer alleviating his pain.   He was mowing the yard yesterday and felt some discomfort. He denies difficulty walking but still has trouble when he puts weight on it getting out of the car or going up the stairs.  He denies feeling any sensation of his leg giving away or buckling.   He does not have a history of gout.   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    Past Medical History  Diagnosis Date  . Anemia     Current Outpatient Prescriptions on File Prior to Visit  Medication Sig Dispense Refill  . Ascorbic Acid (VITAMIN C) 100 MG tablet Take 100 mg by mouth daily.    . fish oil-omega-3 fatty acids 1000 MG capsule Take 1 g by mouth daily.      Marland Kitchen HYDROcodone-acetaminophen (NORCO) 5-325 MG per tablet Take 1-2 tablets by mouth every 4 (four) hours as needed. 30 tablet 0  . naproxen sodium (ANAPROX DS) 550 MG tablet Take 1 tablet (550 mg total) by mouth 2 (two) times daily with a meal. 40 tablet 0  . predniSONE (DELTASONE) 20 MG tablet Take 2 tablets (40 mg total) by mouth daily. 10  tablet 1   No current facility-administered medications on file prior to visit.    Allergies  Allergen Reactions  . Mold Extract [Trichophyton]        Review of Systems  Constitutional: Negative for fever and chills.  Musculoskeletal: Positive for myalgias. Negative for back pain, joint swelling, gait problem, neck pain and neck stiffness.  Skin: Negative for rash and wound.       Objective:   Physical Exam  Constitutional: He is oriented to person, place, and time. He appears well-developed and well-nourished. No distress.  HENT:  Head: Normocephalic and atraumatic.  Eyes: Conjunctivae and EOM are normal. Pupils are equal, round, and reactive to light.  Neck: Neck supple.  Cardiovascular: Normal rate.   Pulmonary/Chest: Effort normal. No respiratory distress.  Musculoskeletal: Normal range of motion.  The left knee is not swollen.  It is stable to all stressors. mcmurrays is negative.  The popliteal fossa has no swelling and is non tender.  Muscles around the knee intact.  Patellar ballots freely. He has good flexion and extension without pain but has pain when stepping up.   Neurological: He is alert and oriented to person, place, and time.  Skin: Skin is warm and dry.  Psychiatric: He has a  normal mood and affect. His behavior is normal.  Nursing note and vitals reviewed.  BP 110/62 mmHg  Pulse 82  Temp(Src) 98.1 F (36.7 C) (Oral)  Resp 16  Ht 5' 8.75" (1.746 m)  Wt 268 lb (121.564 kg)  BMI 39.88 kg/m2  SpO2 97%        Assessment & Plan:  I have completed the patient encounter in its entirety as documented by the scribe, with editing by me where necessary. Aayla Marrocco P. Merla Richesoolittle, M.D.   Knee pain-prolonged Potential for internal derangement   Refer to Dr. Althea CharonMcKinley for further evaluation

## 2015-02-03 ENCOUNTER — Ambulatory Visit (INDEPENDENT_AMBULATORY_CARE_PROVIDER_SITE_OTHER): Payer: BC Managed Care – PPO | Admitting: Emergency Medicine

## 2015-02-03 VITALS — BP 136/80 | HR 98 | Temp 100.0°F | Resp 18 | Ht 69.0 in | Wt 264.0 lb

## 2015-02-03 DIAGNOSIS — J0140 Acute pansinusitis, unspecified: Secondary | ICD-10-CM | POA: Diagnosis not present

## 2015-02-03 DIAGNOSIS — J209 Acute bronchitis, unspecified: Secondary | ICD-10-CM | POA: Diagnosis not present

## 2015-02-03 MED ORDER — PSEUDOEPHEDRINE-GUAIFENESIN ER 60-600 MG PO TB12: 1.0000 | ORAL_TABLET | Freq: Two times a day (BID) | ORAL | Status: DC

## 2015-02-03 MED ORDER — AMOXICILLIN-POT CLAVULANATE 875-125 MG PO TABS: 1.0000 | ORAL_TABLET | Freq: Two times a day (BID) | ORAL | Status: DC

## 2015-02-03 MED ORDER — HYDROCOD POLST-CPM POLST ER 10-8 MG/5ML PO SUER: 5.0000 mL | Freq: Two times a day (BID) | ORAL | Status: DC

## 2015-02-03 NOTE — Patient Instructions (Signed)

## 2015-02-03 NOTE — Progress Notes (Signed)
[facility] [address], [place] [address] [phone]  Date:  02/03/2015   Name:  Alan Sanchez Isola   DOB:  07/22/1971   MRN:  [id number]  PCP:  No PCP Per Patient    Chief Complaint: Extremity Weakness; Fever; Generalized Body Aches; Cough; and Nasal Congestion   History of Present Illness:  Alan Sanchez Banas is a 44 y.o. very pleasant male patient who presents with the following:  Ill since Monday with fever and chills Has nasal congestion and mucopurulent discharge Has cough productive purulent sputum.  No wheezing or shortness of breath. No nausea or vomiting.  No stool change No improvement with over the counter medications or other home remedies.  Denies other complaint or health concern today.   Patient Active Problem List   Diagnosis Date Noted  . Bilateral knee pain 04/23/2014  . Right elbow pain 04/23/2014  . Left shoulder pain 04/23/2014  . OBESITY, UNSPECIFIED 12/09/2009  . PALPITATIONS 12/09/2009  . ABNORMAL ELECTROCARDIOGRAM 12/09/2009    Past Medical History  Diagnosis Date  . Anemia     Past Surgical History  Procedure Laterality Date  . Finger amputation      Tip of Right middle finger  . Wisdom tooth extraction      History  Substance Use Topics  . Smoking status: Never Smoker   . Smokeless tobacco: Never Used  . Alcohol Use: 0.0 oz/week    0 Standard drinks or equivalent per week     Comment: Rarely    Family History  Problem Relation Age of Onset  . Hypertension Father   . Arrhythmia Neg Hx   . Heart disease Neg Hx     No early  . Cardiomyopathy Neg Hx     Nor sudden cardiac death  . Hypertension Brother   . Diabetes Mother     Allergies  Allergen Reactions  . Mold Extract [Trichophyton]     Medication list has been reviewed and updated.  Current Outpatient Prescriptions on File Prior to Visit  Medication Sig Dispense Refill  . Ascorbic Acid (VITAMIN C) 100 MG tablet Take 100 mg by mouth daily.     . fish oil-omega-3 fatty acids 1000 MG capsule Take 1 g by mouth daily.      Marland Kitchen. HYDROcodone-acetaminophen (NORCO) 5-325 MG per tablet Take 1-2 tablets by mouth every 4 (four) hours as needed. (Patient not taking: Reported on 02/03/2015) 30 tablet 0  . naproxen sodium (ANAPROX DS) 550 MG tablet Take 1 tablet (550 mg total) by mouth 2 (two) times daily with a meal. (Patient not taking: Reported on 02/03/2015) 40 tablet 0  . predniSONE (DELTASONE) 20 MG tablet Take 2 tablets (40 mg total) by mouth daily. (Patient not taking: Reported on 02/03/2015) 10 tablet 1   No current facility-administered medications on file prior to visit.    Review of Systems:  Review of Systems  Constitutional: Negative for fever, chills and fatigue.  HENT: Negative for congestion, ear pain, hearing loss, postnasal drip, rhinorrhea and sinus pressure.   Eyes: Negative for discharge and redness.  Respiratory: Negative for cough, shortness of breath and wheezing.   Cardiovascular: Negative for chest pain and leg swelling.  Gastrointestinal: Negative for nausea, vomiting, abdominal pain, constipation and blood in stool.  Genitourinary: Negative for dysuria, urgency and frequency.  Musculoskeletal: Negative for neck stiffness.  Skin: Negative for rash.  Neurological: Negative for seizures, weakness and headaches.   Physical Examination: Filed Vitals:   02/03/15 1430  BP: 136/80  Pulse: 98  Temp: 100 F (37.8 C)  Resp: 18   Filed Vitals:   02/03/15 1430  Height: 5\' 9"  (1.753 m)  Weight: 264 lb (119.75 kg)   Body mass index is 38.97 kg/(m^2). Ideal Body Weight: Weight in (lb) to have BMI = 25: 168.9  GEN: WDWN, NAD, Non-toxic, A & O x 3 HEENT: Atraumatic, Normocephalic. Neck supple. No masses, No LAD. Ears and Nose: No external deformity. CV: RRR, No M/G/R. No JVD. No thrill. No extra heart sounds. PULM: CTA B, no wheezes, crackles, rhonchi. No retractions. No resp. distress. No accessory muscle use. ABD: S,  NT, ND, +BS. No rebound. No HSM. EXTR: No c/c/e NEURO Normal gait.  PSYCH: Normally interactive. Conversant. Not depressed or anxious appearing.  Calm demeanor.    Assessment and Plan: Acute bronchitis augmentin  mucinex d tussionex   Signed Phillips OdorJeffery Anderson, MD

## 2015-04-13 ENCOUNTER — Ambulatory Visit (INDEPENDENT_AMBULATORY_CARE_PROVIDER_SITE_OTHER): Payer: BC Managed Care – PPO | Admitting: Family Medicine

## 2015-04-13 VITALS — BP 122/84 | HR 83 | Temp 98.5°F | Resp 16 | Ht 69.0 in | Wt 298.4 lb

## 2015-04-13 DIAGNOSIS — K649 Unspecified hemorrhoids: Secondary | ICD-10-CM

## 2015-04-13 DIAGNOSIS — K625 Hemorrhage of anus and rectum: Secondary | ICD-10-CM | POA: Diagnosis not present

## 2015-04-13 LAB — POCT CBC
Granulocyte percent: 62.9 % (ref 37–80)
HCT, POC: 44.8 % (ref 43.5–53.7)
Hemoglobin: 14.8 g/dL (ref 14.1–18.1)
Lymph, poc: 2.6 (ref 0.6–3.4)
MCH, POC: 27.2 pg (ref 27–31.2)
MCHC: 33.2 g/dL (ref 31.8–35.4)
MCV: 82 fL (ref 80–97)
MID (cbc): 0.2 (ref 0–0.9)
MPV: 8.1 fL (ref 0–99.8)
POC Granulocyte: 4.7 (ref 2–6.9)
POC LYMPH PERCENT: 34.1 % (ref 10–50)
POC MID %: 3 %M (ref 0–12)
Platelet Count, POC: 241 10*3/uL (ref 142–424)
RBC: 5.46 M/uL (ref 4.69–6.13)
RDW, POC: 13.4 %
WBC: 7.5 10*3/uL (ref 4.6–10.2)

## 2015-04-13 MED ORDER — HYDROCORTISONE 2.5 % RE CREA: 1.0000 "application " | TOPICAL_CREAM | Freq: Two times a day (BID) | RECTAL | Status: DC | PRN

## 2015-04-13 NOTE — Patient Instructions (Signed)

## 2015-04-13 NOTE — Progress Notes (Signed)
Chief Complaint:  Chief Complaint  Patient presents with  . Rectal Bleeding    x 2 weeks  . Rectal Pain    x 2 weeks    HPI: Alan Sanchez is a 44 y.o. male who [facility] complaining of red blood per rectum for the last 2 weeks off and on. He only gets it when he wipes. It's usually on his tissue paper. He does not have any unintentional weight loss, diarrhea. He also states that it is worse when he wipes after he is walked. There is no family history of any type of colon cancer. He denies any history of hemorrhoids. On many supplements but he does not think that there is any iron in it. SAM-E, metabolism booster supplements, glucosamine chondroitin , vitamin D and B . He denis any hemorrhoid, No prior colosncopy. NO family hx of crohns, colon cancer, IBD. He has had rectal pain when water hits the rectal area. He has a history of anemia. He denies drinking alcohol or excessive use of NSAIDs.   Past Medical History  Diagnosis Date  . Anemia    Past Surgical History  Procedure Laterality Date  . Finger amputation      Tip of Right middle finger  . Wisdom tooth extraction     History   Social History  . Marital Status: Single    Spouse Name: N/A  . Number of Children: 1  . 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 Smoking status: Never Smoker   . Smokeless tobacco: Never Used  . Alcohol Use: 0.0 oz/week    0 Standard drinks or equivalent per week     Comment: Rarely  . Drug Use: Not on file  . Sexual Activity: Not on file   Other Topics Concern  . None   Social History Narrative   Has a 45 year old daughter.   Family History  Problem Relation Age of Onset  . Hypertension Father   . Arrhythmia Neg Hx   . Heart disease Neg Hx     No early  . Cardiomyopathy Neg Hx     Nor sudden cardiac death  . Hypertension Brother   . Diabetes Mother    Allergies  Allergen Reactions  . Mold  Extract [Trichophyton]    Prior to Admission medications   Medication Sig Start Date End Date Taking? Authorizing Provider  Ascorbic Acid (VITAMIN C) 100 MG tablet Take 100 mg by mouth daily.   Yes Historical Provider, MD  Cyanocobalamin (VITAMIN B12 PO) Take by mouth.   Yes Historical Provider, MD  fish oil-omega-3 fatty acids 1000 MG capsule Take 1 g by mouth daily.     Yes Historical Provider, MD  amoxicillin-clavulanate (AUGMENTIN) 875-125 MG per tablet Take 1 tablet by mouth 2 (two) times daily. Patient not taking: Reported on 04/13/2015 02/03/15   Carmelina DaneJeffery S Anderson, MD  chlorpheniramine-HYDROcodone [facility](TUSSIONEX PENNKINETIC ER) 10-8 MG/5ML SUER Take 5 mLs by mouth 2 (two) times daily. Patient not taking: Reported on 04/13/2015 02/03/15   Carmelina DaneJeffery S Anderson, MD  HYDROcodone-acetaminophen [facility](NORCO) 5-325 MG per tablet Take 1-2 tablets by mouth every 4 (four) hours as needed. Patient not taking: Reported on 02/03/2015 12/19/14   Carmelina DaneJeffery S Anderson, MD  naproxen sodium (ANAPROX DS) 550 MG tablet Take 1 tablet (550 mg total) by mouth 2 (two) times daily with a meal. Patient not taking:  Reported on 02/03/2015 12/19/14 12/19/15  Carmelina Dane, MD  predniSONE (DELTASONE) 20 MG tablet Take 2 tablets (40 mg total) by mouth daily. Patient not taking: Reported on 02/03/2015 12/12/14   Elvina Sidle, MD  pseudoephedrine-guaifenesin [facility] D) 60-600 MG per tablet Take 1 tablet by mouth every 12 (twelve) hours. Patient not taking: Reported on 04/13/2015 02/03/15 02/03/16  Carmelina Dane, MD     ROS: The patient denies fevers, chills, night sweats, unintentional weight loss, chest pain, palpitations, wheezing, dyspnea on exertion, nausea, vomiting, abdominal pain, dysuria, hematuria, melena, numbness, weakness, or tingling.  All other systems have been reviewed and were otherwise negative with the exception of those mentioned in the HPI and as above.    PHYSICAL EXAM: Filed Vitals:   04/13/15 1309  BP: 122/84    Pulse: 83  Temp: 98.5 F (36.9 C)  Resp: 16   Body mass index is 44.05 kg/(m^2).   General: Alert, no acute distress, obese African American male HEENT:  Normocephalic, atraumatic, oropharynx patent. EOMI, PERRLA Cardiovascular:  Regular rate and rhythm, no rubs murmurs or gallops.  No Carotid bruits, radial pulse intact. No pedal edema.  Respiratory: Clear to auscultation bilaterally.  No wheezes, rales, or rhonchi.  No cyanosis, no use of accessory musculature Abdominal: No organomegaly, abdomen is soft and non-tender, positive bowel sounds. No masses. Skin: No rashes. Neurologic: Facial musculature symmetric. Psychiatric: Patient acts appropriately throughout our interaction. Lymphatic: No cervical or submandibular lymphadenopathy Musculoskeletal: Gait intact.  Normal prostate exam. There were no hard masses or lesions. At the 11:00 position there was a small external hemorrhoid with a slight skin tear. No appreciable overt bleeding      LABS: Results for orders placed or performed in visit on 04/13/15  POCT CBC  Result Value Ref Range   WBC 7.5 4.6 - 10.2 K/uL   Lymph, poc 2.6 0.6 - 3.4   POC LYMPH PERCENT 34.1 10 - 50 %L   MID (cbc) 0.2 0 - 0.9   POC MID % 3.0 0 - 12 %M   POC Granulocyte 4.7 2 - 6.9   Granulocyte percent 62.9 37 - 80 %G   RBC 5.46 4.69 - 6.13 M/uL   Hemoglobin 14.8 14.1 - 18.1 g/dL   HCT, POC 69.6 29.5 - 53.7 %   MCV 82.0 80 - 97 fL   MCH, POC 27.2 27 - 31.2 pg   MCHC 33.2 31.8 - 35.4 g/dL   RDW, POC 28.4 %   Platelet Count, POC 241 142 - 424 K/uL   MPV 8.1 0 - 99.8 fL     EKG/XRAY:   Primary read interpreted by Dr. Conley Rolls at [facility].   ASSESSMENT/PLAN: Encounter Diagnoses  Name Primary?  . Rectal bleeding Yes  . Acute hemorrhoid    Sitz baths, Anusol, constipation and hemorrhoid precautions. CBC is stable.  He was sent home with hemosure. If this continues then we will need to refer him to gastroenterology.  Gross sideeffects, risk and  benefits, and alternatives of medications d/w patient. Patient is aware that all medications have potential sideeffects and we are unable to predict every sideeffect or drug-drug interaction that may occur.  Quantarius Genrich DO  04/13/2015 2:03 PM

## 2015-11-24 ENCOUNTER — Ambulatory Visit (INDEPENDENT_AMBULATORY_CARE_PROVIDER_SITE_OTHER): Payer: BC Managed Care – PPO | Admitting: Physician Assistant

## 2015-11-24 VITALS — BP 120/76 | HR 82 | Temp 98.6°F | Resp 16 | Ht 68.5 in | Wt 276.0 lb

## 2015-11-24 DIAGNOSIS — H01009 Unspecified blepharitis unspecified eye, unspecified eyelid: Secondary | ICD-10-CM

## 2015-11-24 DIAGNOSIS — K644 Residual hemorrhoidal skin tags: Secondary | ICD-10-CM

## 2015-11-24 DIAGNOSIS — K648 Other hemorrhoids: Secondary | ICD-10-CM | POA: Diagnosis not present

## 2015-11-24 NOTE — Patient Instructions (Signed)
Please contact me if you would like a referral to a GI doctor.

## 2015-11-24 NOTE — Progress Notes (Signed)
11/24/2015 3:14 PM   DOB: 04/04/1971 / MRN: [id number]  SUBJECTIVE:  Alan Sanchez is a 45 y.o. male presenting for eye lid itching that started 1 month ago and has improved.  He is here to make sure that every thing is okay with his eye.    He complains of an external hemorrhoid that hurts him from time to time, and he will often see blood on the toilet paper with wiping.  He is concerned that he may have cancer and feels that he may need a colonoscopy.  He denies a family history of colon cancer.  He denies fever, night sweats, chills.    He does not want to know what his weight is today.   He is allergic to mold extract.   He  has a past medical history of Anemia.    He  reports that he has never smoked. He has never used smokeless tobacco. He reports that he drinks alcohol. He  has no sexual activity history on file. The patient  has past surgical history that includes Finger amputation and Wisdom tooth extraction.  His family history includes Diabetes in his mother; Hypertension in his brother and father. There is no history of Arrhythmia, Heart disease, or Cardiomyopathy.  Review of Systems  Constitutional: Negative for fever and chills.  Eyes: Negative for blurred vision, double vision, photophobia, pain, discharge and redness.  Respiratory: Negative for cough and shortness of breath.   Cardiovascular: Negative for chest pain.  Gastrointestinal: Negative for nausea and abdominal pain.  Genitourinary: Negative for dysuria, urgency and frequency.  Musculoskeletal: Negative for myalgias.  Skin: Negative for rash.  Neurological: Negative for dizziness, tingling and headaches.  Psychiatric/Behavioral: Negative for depression. The patient is not nervous/anxious.     Problem list and medications reviewed and updated by myself where necessary, and exist elsewhere in the encounter.   OBJECTIVE:  BP 120/76 mmHg  Pulse 82  Temp(Src) 98.6 F (37 C) (Oral)  Resp 16  Ht 5' 8.5"  (1.74 m)  Wt 276 lb (125.193 kg)  BMI 41.35 kg/m2  SpO2 98%  Physical Exam  Constitutional: He is oriented to person, place, and time. He appears well-developed. He does not appear ill.  Eyes: Conjunctivae, EOM and lids are normal. Pupils are equal, round, and reactive to light.  Cardiovascular: Normal rate.   Pulmonary/Chest: Effort normal.  Abdominal: He exhibits no distension.  Genitourinary:     Musculoskeletal: Normal range of motion.  Neurological: He is alert and oriented to person, place, and time. No cranial nerve deficit. Coordination normal.  Skin: Skin is warm and dry. He is not diaphoretic.  Psychiatric: He has a normal mood and affect.  Nursing note and vitals reviewed.   No results found for this or any previous visit (from the past 72 hour(s)).  No results found.   Visual Acuity Screening   Right eye Left eye Both eyes  Without correction:     With correction:     ASSESSMENT AND PLAN  Alan Sanchez was seen today for right eye and hemorrhoids.  Diagnoses and all orders for this visit:  External hemorrhoid: Minimal.  Advised against colon screen before 50 as he has no risk factors. If he remains worried advised he call if he needs a referral.    Blepharitis, unspecified laterality: SYmptomatic diagnosis.  If his symptoms do not abate with vaseline will send in pantonol drops.      The patient was advised to call or  return to clinic if he does not see an improvement in symptoms or to seek the care of the closest emergency department if he worsens with the above plan.   Deliah Boston, MHS, PA-C [facility] 11/24/2015 3:14 PM

## 2016-01-13 ENCOUNTER — Ambulatory Visit (INDEPENDENT_AMBULATORY_CARE_PROVIDER_SITE_OTHER): Payer: BC Managed Care – PPO | Admitting: Emergency Medicine

## 2016-01-13 ENCOUNTER — Ambulatory Visit (INDEPENDENT_AMBULATORY_CARE_PROVIDER_SITE_OTHER): Payer: BC Managed Care – PPO

## 2016-01-13 VITALS — BP 136/80 | HR 83 | Temp 98.0°F | Resp 17 | Ht 68.5 in | Wt 282.0 lb

## 2016-01-13 DIAGNOSIS — M5489 Other dorsalgia: Secondary | ICD-10-CM

## 2016-01-13 MED ORDER — CYCLOBENZAPRINE HCL 10 MG PO TABS: ORAL_TABLET | ORAL | Status: DC

## 2016-01-13 MED ORDER — MELOXICAM 15 MG PO TABS: 15.0000 mg | ORAL_TABLET | Freq: Every day | ORAL | Status: DC

## 2016-01-13 NOTE — Progress Notes (Addendum)
By signing my name below, I, [name], attest that this documentation has been prepared under the direction and in the presence of Lesle ChrisSteven Aaralynn Shepheard, MD.  Electronically Signed: [name], Medical Scribe. 01/13/2016. 12:27 PM.  Chief Complaint:  Chief Complaint  Patient presents with  . Back Pain    of unknown origin     HPI: Alan Sanchez is a 45 y.o. male who [facility] complaining of non-radiating lower back pain that began a week ago. Pt describes his pain as tightness in his lower back. Pt says he has a hx of back pain. Pt reports pain improves with stretches, massages, and ibuprophen. He denies weakness, or numbness in legs. Pt reports getting x-ray done at chiropractor a few years ago. 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     Past Medical History  Diagnosis Date  . Anemia    Past Surgical History  Procedure Laterality Date  . Finger amputation      Tip of Right middle finger  . Wisdom tooth extraction     Social History   Social History  . Marital Status: Single    Spouse Name: N/A  . Number of Children: 1  . 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 Smoking status: Never Smoker   . Smokeless tobacco: Never Used  . Alcohol Use: 0.0 oz/week    0 Standard drinks or equivalent per week     Comment: Rarely  . Drug Use: None  . Sexual Activity: Not Asked   Other Topics Concern  . None   Social History Narrative   Has a 46 year old daughter.   Family History  Problem Relation Age of Onset  . Hypertension Father   . Arrhythmia Neg Hx   . Heart disease Neg Hx     No early  . Cardiomyopathy Neg Hx     Nor sudden cardiac death  . Hypertension Brother   . Diabetes Mother    Allergies  Allergen Reactions  . Mold Extract [Trichophyton]    Prior to Admission medications   Medication Sig Start Date End Date Taking? Authorizing Provider  glucosamine-chondroitin 500-400  MG tablet Take 1 tablet by mouth 3 (three) times daily.   Yes Historical Provider, MD  Omega-3 Fatty Acids (FISH OIL) 1000 MG CAPS Take by mouth.   Yes Historical Provider, MD  S-Adenosylmethionine (SAM-E) 200 MG TABS Take by mouth.   Yes Historical Provider, MD  hydrocortisone (ANUSOL-HC) 2.5 % rectal cream Place 1 application rectally 2 (two) times daily as needed for hemorrhoids or itching. Patient not taking: Reported on 11/24/2015 04/13/15   Thao P Le, DO     ROS: The patient denies fevers, chills, night sweats, unintentional weight loss, chest pain, palpitations, wheezing, dyspnea on exertion, nausea, vomiting, abdominal pain, dysuria, hematuria, melena, numbness, weakness, or tingling.  All other systems have been reviewed and were otherwise negative with the exception of those mentioned in the HPI and as above.    PHYSICAL EXAM: Filed Vitals:   01/13/16 1110  BP: 136/80  Pulse: 83  Temp: 98 F (36.7 C)  Resp: 17   Body mass index is 42.25 kg/(m^2).   General: Alert, no acute distress HEENT:  Normocephalic, atraumatic, oropharynx patent. [facility] Cardiovascular:  Regular rate and rhythm, no rubs murmurs or gallops.  No Carotid bruits, radial pulse intact. No pedal edema.  Respiratory: Clear to  auscultation bilaterally.  No wheezes, rales, or rhonchi.  No cyanosis, no use of accessory musculature Abdominal: No organomegaly, abdomen is soft and non-tender, positive bowel sounds.  No masses. Musculoskeletal: Gait intact. No edema, tenderness. Minimal tenderness over lover spine. Strength is 5/5 and trace knee reflexes and ankles 2+. No weakness over lower extremities. No evidence of sciatica. Skin: No rashes. Neurologic: Facial musculature symmetric. Psychiatric: Patient acts appropriately throughout our interaction. Lymphatic: No cervical or submandibular lymphadenopathy  LABS:  EKG/XRAY:   Primary read interpreted by Dr. Cleta Alberts at [facility].  Dg Lumbar Spine  Complete  01/13/2016  CLINICAL DATA:  Low back pain for 1 week, no known injury, initial encounter EXAM: LUMBAR SPINE - COMPLETE 4+ VIEW COMPARISON:  04/17/2012 FINDINGS: Five lumbar type vertebral bodies are well visualized. Vertebral body height is well maintained. No evidence of spondylolysis or spondylolisthesis is seen. Mild disc space narrowing is noted at L4-5. IMPRESSION: Mild degenerative change without acute abnormality. Electronically Signed   By: Alcide Clever M.D.   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 He will be on Mobitz one a day along with Flexeril at night. Needs to lose weight and exercise. I gave him stretching exercises to do.   Gross sideeffects, risk and benefits, and alternatives of medications d/w patient. Patient is aware that all medications have potential sideeffects and we are unable to predict every sideeffect or drug-drug interaction that may occur.  Lesle Chris MD 01/13/2016 12:26 PM

## 2016-01-13 NOTE — Patient Instructions (Addendum)
   IF you received an x-ray today, you will receive an invoice from [facility]. Please contact [facility] at [phone] with questions or concerns regarding your invoice.   IF you received labwork today, you will receive an invoice from [facility]. Please contact [facility] at [phone] with questions or concerns regarding your invoice.   Our billing staff will not be able to assist you with questions regarding bills from these companies.  You will be contacted with the lab results as soon as they are available. The fastest way to get your results is to activate your My Chart account. Instructions are located on the last page of this paperwork. If you have not heard from us regarding the results in 2 weeks, please contact this office.     Lumbosacral Strain Lumbosacral strain is a strain of any of the parts that make up your lumbosacral vertebrae. Your lumbosacral vertebrae are the bones that make up the lower third of your backbone. Your lumbosacral vertebrae are held together by muscles and tough, fibrous tissue (ligaments).  CAUSES  A sudden blow to your back can cause lumbosacral strain. Also, anything that causes an excessive stretch of the muscles in the low back can cause this strain. This is typically seen when people exert themselves strenuously, fall, lift heavy objects, bend, or crouch repeatedly. RISK FACTORS  Physically demanding work.  Participation in pushing or pulling sports or sports that require a sudden twist of the back (tennis, golf, baseball).  Weight lifting.  Excessive lower back curvature.  Forward-tilted pelvis.  Weak back or abdominal muscles or both.  Tight hamstrings. SIGNS AND SYMPTOMS  Lumbosacral strain may cause pain in the area of your injury or pain that moves (radiates) down your leg.  DIAGNOSIS Your health care provider can often diagnose lumbosacral strain through a physical exam. In some  cases, you may need tests such as X-ray exams.  TREATMENT  Treatment for your lower back injury depends on many factors that your clinician will have to evaluate. However, most treatment will include the use of anti-inflammatory medicines. HOME CARE INSTRUCTIONS   Avoid hard physical activities (tennis, racquetball, waterskiing) if you are not in proper physical condition for it. This may aggravate or create problems.  If you have a back problem, avoid sports requiring sudden body movements. Swimming and walking are generally safer activities.  Maintain good posture.  Maintain a healthy weight.  For acute conditions, you may put ice on the injured area.  Put ice in a plastic bag.  Place a towel between your skin and the bag.  Leave the ice on for 20 minutes, 2-3 times a day.  When the low back starts healing, stretching and strengthening exercises may be recommended. SEEK MEDICAL CARE IF:  Your back pain is getting worse.  You experience severe back pain not relieved with medicines. SEEK IMMEDIATE MEDICAL CARE IF:   You have numbness, tingling, weakness, or problems with the use of your arms or legs.  There is a change in bowel or bladder control.  You have increasing pain in any area of the body, including your belly (abdomen).  You notice shortness of breath, dizziness, or feel faint.  You feel sick to your stomach (nauseous), are throwing up (vomiting), or become sweaty.  You notice discoloration of your toes or legs, or your feet get very cold. MAKE SURE YOU:   Understand these instructions.  Will watch your condition.  Will get help right away if   you are not doing well or get worse.   This information is not intended to replace advice given to you by your health care provider. Make sure you discuss any questions you have with your health care provider.   Document Released: 06/27/2005 Document Revised: 10/08/2014 Document Reviewed: 05/06/2013 Elsevier  Interactive Patient Education 2016 Elsevier Inc.  

## 2016-02-03 ENCOUNTER — Ambulatory Visit (INDEPENDENT_AMBULATORY_CARE_PROVIDER_SITE_OTHER): Payer: BC Managed Care – PPO | Admitting: Physician Assistant

## 2016-02-03 VITALS — BP 130/84 | HR 98 | Temp 98.2°F | Resp 17 | Ht 69.0 in | Wt 280.0 lb

## 2016-02-03 DIAGNOSIS — S86011A Strain of right Achilles tendon, initial encounter: Secondary | ICD-10-CM

## 2016-02-03 NOTE — Progress Notes (Signed)
[facility] [address] [place] [address] [phone]  Date:  02/03/2016   Name:  Alan Sanchez   DOB:  Oct 06, 1970   MRN:  [id number]  PCP:  No PCP Per Patient    Chief Complaint: Leg Pain   History of Present Illness:  This is a 45 y.o. male who is presenting with right calf pain since earlier today. He was playing volleyball. 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 He went to go for a ball when he suddenly felt like he had been punched in the back of his calf. Unable to bear weight. Denies numbness. Has not tried anything for pain yet. No recent abx.  Review of Systems:  Review of Systems See HPI  Patient Active Problem List   Diagnosis Date Noted  . Bilateral knee pain 04/23/2014  . Right elbow pain 04/23/2014  . Left shoulder pain 04/23/2014  . OBESITY, UNSPECIFIED 12/09/2009  . PALPITATIONS 12/09/2009  . ABNORMAL ELECTROCARDIOGRAM 12/09/2009    Prior to Admission medications   Medication Sig Start Date End Date Taking? Authorizing Provider  cyclobenzaprine (FLEXERIL) 10 MG tablet 1 by mouth daily at bedtime 01/13/16  Yes Collene Gobble, MD  glucosamine-chondroitin 500-400 MG tablet Take 1 tablet by mouth 3 (three) times daily.   Yes Historical Provider, MD  meloxicam (MOBIC) 15 MG tablet Take 1 tablet (15 mg total) by mouth daily. 01/13/16  Yes Collene Gobble, MD  Omega-3 Fatty Acids (FISH OIL) 1000 MG CAPS Take by mouth.   Yes Historical Provider, MD  S-Adenosylmethionine (SAM-E) 200 MG TABS Take by mouth.   Yes Historical Provider, MD    Allergies  Allergen Reactions  . Mold Extract [Trichophyton]     Past Surgical History  Procedure Laterality Date  . Finger amputation      Tip of Right middle finger  . Wisdom tooth extraction      Social History  Substance Use Topics  . Smoking status: Never Smoker   . Smokeless tobacco: Never Used  . Alcohol Use: 0.0 oz/week    0 Standard drinks or  equivalent per week     Comment: Rarely    Family History  Problem Relation Age of Onset  . Hypertension Father   . Arrhythmia Neg Hx   . Heart disease Neg Hx     No early  . Cardiomyopathy Neg Hx     Nor sudden cardiac death  . Hypertension Brother   . Diabetes Mother     Medication list has been reviewed and updated.  Physical Examination:  Physical Exam  Constitutional: He is oriented to person, place, and time. He appears well-developed and well-nourished. No distress.  HENT:  Head: Normocephalic and atraumatic.  Right Ear: Hearing normal.  Left Ear: Hearing normal.  Nose: Nose normal.  Eyes: Conjunctivae and lids are normal. Right eye exhibits no discharge. Left eye exhibits no discharge. No scleral icterus.  Cardiovascular: Normal rate, regular rhythm and normal pulses.   Pulmonary/Chest: Effort normal. No respiratory distress.  Musculoskeletal: Normal range of motion.       Left ankle: Normal.       Right lower leg: He exhibits tenderness.       Left lower leg: Normal.  Right calf is tight and tender Pt can mildly plantar and dorsally flex his right foot. Thompson test positive on the right side. Able to trace right achilles down to heel, however not taught.  Soft, and moveable.  Neurological: He is alert and oriented to person, place, and time. No sensory deficit. Gait (unable to bear weight on right) abnormal.  Skin: Skin is warm, dry and intact. No lesion and no rash noted.  Psychiatric: He has a normal mood and affect. His speech is normal and behavior is normal. Thought content normal.    BP 130/84 mmHg  Pulse 98  Temp(Src) 98.2 F (36.8 C) (Oral)  Resp 17  Ht 5\' 9"  (1.753 m)  Wt 280 lb (127.007 kg)  BMI 41.33 kg/m2  SpO2 99%  Assessment and Plan:  1. Achilles rupture, right, initial encounter Suspected achilles tendon rupture. He was placed in posterior leg splint with foot slightly plantar flexed. Fit for crutches. Counseled on RICE therapy.  Referred to ortho for eval. - Ambulatory referral to Orthopedic Surgery   Roswell MinersNicole V. Dyke BrackettBush, PA-C, MHS [facility]  02/03/2016

## 2016-02-03 NOTE — Patient Instructions (Addendum)
Take ibuprofen 600 mg three times a day for next 5-7 days. Rest, apply ice for 10-15 minutes at a time, elevate your leg. Stay on crutches and off your right foot. Keep splint on until you see ortho -- make take off and bath You will get a phone call to make appt with ortho    IF you received an x-ray today, you will receive an invoice from [facility]. Please contact [facility] at [phone] with questions or concerns regarding your invoice.   IF you received labwork today, you will receive an invoice from [facility]. Please contact [facility] at [phone] with questions or concerns regarding your invoice.   Our billing staff will not be able to assist you with questions regarding bills from these companies.  You will be contacted with the lab results as soon as they are available. The fastest way to get your results is to activate your My Chart account. Instructions are located on the last page of this paperwork. If you have not heard from us regarding the results in 2 weeks, please contact this office.

## 2016-02-08 ENCOUNTER — Encounter (HOSPITAL_BASED_OUTPATIENT_CLINIC_OR_DEPARTMENT_OTHER): Payer: Self-pay | Admitting: *Deleted

## 2016-02-08 ENCOUNTER — Other Ambulatory Visit: Payer: Self-pay | Admitting: Orthopaedic Surgery

## 2016-02-08 NOTE — H&P (Signed)
Alan Sanchez is an 45 y.o. male.    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 Althea Charon in the past about a knee issue.  He is here today with a right Achilles rupture.  He was playing volleyball a few days ago when he felt something pop.  He was convinced someone had kicked him.  Since that time he has had some pain which is actually a little bit better.  He also has weakness.  He is here for management.  He would like to get back to volleyball and other sports if at all possible.  Radiographs:  X-rays that were ordered, performed, and interpreted by me today included 3 views of the right ankle.  These look normal.  Past Medical History  Diagnosis Date  . Achilles tendon rupture     right    Past Surgical History  Procedure Laterality Date  . Finger amputation      Tip of Right middle finger  . Wisdom tooth extraction      Family History  Problem Relation Age of Onset  . Hypertension Father   . Arrhythmia Neg Hx   . Heart disease Neg Hx     No early  . Cardiomyopathy Neg Hx     Nor sudden cardiac death  . Hypertension Brother   . Diabetes Mother    Social History:  reports that he has never smoked. He has never used smokeless tobacco. He reports that he drinks alcohol. His drug history is not on file.  Allergies:  Allergies  Allergen Reactions  . Mold Extract [Trichophyton]     No prescriptions prior to admission    No results found for this or any previous visit (from the past 48 hour(s)). No results found.  Review of Systems  Musculoskeletal: Positive for joint pain.       Right achillies  All other systems reviewed and are negative.   Height  (1.753 m), weight 129.275 kg (285 lb). Physical Exam  Constitutional: He is oriented to person, place, and time. He appears well-developed and well-nourished.  HENT:  Head: Normocephalic and atraumatic.  Eyes: Pupils  are equal, round, and reactive to light.  Neck: Normal range of motion.  Cardiovascular: Normal rate and regular rhythm.   Respiratory: Effort normal.  GI: Soft.  Musculoskeletal:  Right ankle motion is full.  He is a palpable defect at the musculotendinous junction.  Thompson test shows no motion of this foot compared to normal motion on the opposite side.  Sensation and motor function are otherwise intact in both feet.  Palpable pulses on both sides.    Neurological: He is alert and oriented to person, place, and time.  Skin: Skin is warm and dry.  Psychiatric: He has a normal mood and affect. His behavior is normal. Judgment and thought content normal.     Assessment/Plan Assessment: Right Achilles tendon rupture  Plan: Alan Sanchez has a complete rupture of his Achilles tendon.  I've gone over operative and nonoperative managements.  Typically in someone who is relatively young and athletically active we treat this surgically but this certainly can be treated in a nonoperative fashion with immobilization.  I've gone over risks of anesthesia, infection, repeat rupture and together we decided on operative management. He will be in a postoperative splint nonweightbearing for about 2 weeks at which point he can start to bear weight in a boot assuming we get a  good repair.    Derrious Bologna, Ginger OrganNDREW PAUL, PA-C 02/08/2016, 8:18 PM

## 2016-02-10 ENCOUNTER — Ambulatory Visit (HOSPITAL_BASED_OUTPATIENT_CLINIC_OR_DEPARTMENT_OTHER): Payer: BC Managed Care – PPO | Admitting: Anesthesiology

## 2016-02-10 ENCOUNTER — Encounter (HOSPITAL_BASED_OUTPATIENT_CLINIC_OR_DEPARTMENT_OTHER): Payer: Self-pay | Admitting: Anesthesiology

## 2016-02-10 ENCOUNTER — Ambulatory Visit (HOSPITAL_BASED_OUTPATIENT_CLINIC_OR_DEPARTMENT_OTHER)
Admission: RE | Admit: 2016-02-10 | Discharge: 2016-02-10 | Disposition: A | Discharge status: Home / Self Care | Payer: BC Managed Care – PPO | Source: Ambulatory Visit | Attending: Orthopaedic Surgery | Admitting: Orthopaedic Surgery

## 2016-02-10 ENCOUNTER — Encounter (HOSPITAL_BASED_OUTPATIENT_CLINIC_OR_DEPARTMENT_OTHER)
Admission: RE | Disposition: A | Discharge status: Home / Self Care | Payer: Self-pay | Source: Ambulatory Visit | Attending: Orthopaedic Surgery

## 2016-02-10 DIAGNOSIS — S86011A Strain of right Achilles tendon, initial encounter: Secondary | ICD-10-CM | POA: Diagnosis present

## 2016-02-10 DIAGNOSIS — Z89021 Acquired absence of right finger(s): Secondary | ICD-10-CM | POA: Diagnosis not present

## 2016-02-10 DIAGNOSIS — X58XXXA Exposure to other specified factors, initial encounter: Secondary | ICD-10-CM | POA: Insufficient documentation

## 2016-02-10 HISTORY — DX: Strain of unspecified achilles tendon, initial encounter: S86.019A

## 2016-02-10 HISTORY — PX: ACHILLES TENDON SURGERY: SHX542

## 2016-02-10 SURGERY — REPAIR, TENDON, ACHILLES
Anesthesia: General | Site: Leg Lower | Laterality: Right

## 2016-02-10 MED ORDER — DEXAMETHASONE SODIUM PHOSPHATE 10 MG/ML IJ SOLN
INTRAMUSCULAR | Status: AC
  Filled 2016-02-10: qty 1

## 2016-02-10 MED ORDER — EPHEDRINE 5 MG/ML INJ
INTRAVENOUS | Status: AC
  Filled 2016-02-10: qty 10

## 2016-02-10 MED ORDER — DEXAMETHASONE SODIUM PHOSPHATE 4 MG/ML IJ SOLN
INTRAMUSCULAR | Status: DC | PRN
  Administered 2016-02-10: 10 mg via INTRAVENOUS

## 2016-02-10 MED ORDER — LACTATED RINGERS IV SOLN
INTRAVENOUS | Status: DC
  Administered 2016-02-10 (×2): via INTRAVENOUS

## 2016-02-10 MED ORDER — CEFAZOLIN SODIUM 1-5 GM-% IV SOLN
INTRAVENOUS | Status: AC
  Filled 2016-02-10: qty 50

## 2016-02-10 MED ORDER — LIDOCAINE 2% (20 MG/ML) 5 ML SYRINGE
INTRAMUSCULAR | Status: AC
  Filled 2016-02-10: qty 5

## 2016-02-10 MED ORDER — HYDROCODONE-ACETAMINOPHEN 5-325 MG PO TABS: 1.0000 | ORAL_TABLET | ORAL | Status: DC | PRN

## 2016-02-10 MED ORDER — BUPIVACAINE-EPINEPHRINE (PF) 0.25% -1:200000 IJ SOLN
INTRAMUSCULAR | Status: AC
  Filled 2016-02-10: qty 30

## 2016-02-10 MED ORDER — CEFAZOLIN SODIUM-DEXTROSE 2-4 GM/100ML-% IV SOLN
INTRAVENOUS | Status: AC
  Filled 2016-02-10: qty 100

## 2016-02-10 MED ORDER — OXYCODONE HCL 5 MG PO TABS
5.0000 mg | ORAL_TABLET | Freq: Once | ORAL | Status: AC
Start: 2016-02-10 — End: 2016-02-10
  Administered 2016-02-10: 5 mg via ORAL

## 2016-02-10 MED ORDER — FENTANYL CITRATE (PF) 100 MCG/2ML IJ SOLN
INTRAMUSCULAR | Status: DC | PRN
Start: 2016-02-10 — End: 2016-02-10
  Administered 2016-02-10 (×2): 50 ug via INTRAVENOUS

## 2016-02-10 MED ORDER — LACTATED RINGERS IV SOLN: INTRAVENOUS | Status: DC

## 2016-02-10 MED ORDER — BUPIVACAINE-EPINEPHRINE 0.25% -1:200000 IJ SOLN
INTRAMUSCULAR | Status: DC | PRN
  Administered 2016-02-10: 10 mL

## 2016-02-10 MED ORDER — FENTANYL CITRATE (PF) 100 MCG/2ML IJ SOLN
INTRAMUSCULAR | Status: AC
  Filled 2016-02-10: qty 2

## 2016-02-10 MED ORDER — OXYCODONE HCL 5 MG PO TABS
ORAL_TABLET | ORAL | Status: AC
  Filled 2016-02-10: qty 1

## 2016-02-10 MED ORDER — HYDROMORPHONE HCL 1 MG/ML IJ SOLN: 0.5000 mg | INTRAMUSCULAR | Status: DC | PRN

## 2016-02-10 MED ORDER — ONDANSETRON HCL 4 MG/2ML IJ SOLN: 4.0000 mg | Freq: Once | INTRAMUSCULAR | Status: DC | PRN

## 2016-02-10 MED ORDER — LIDOCAINE HCL 4 % EX SOLN
CUTANEOUS | Status: DC | PRN
  Administered 2016-02-10: 2 mL via TOPICAL

## 2016-02-10 MED ORDER — MIDAZOLAM HCL 2 MG/2ML IJ SOLN
INTRAMUSCULAR | Status: AC
  Filled 2016-02-10: qty 2

## 2016-02-10 MED ORDER — MIDAZOLAM HCL 2 MG/2ML IJ SOLN
1.0000 mg | INTRAMUSCULAR | Status: DC | PRN
  Administered 2016-02-10: 1 mg via INTRAVENOUS

## 2016-02-10 MED ORDER — GLYCOPYRROLATE 0.2 MG/ML IJ SOLN: 0.2000 mg | Freq: Once | INTRAMUSCULAR | Status: DC | PRN

## 2016-02-10 MED ORDER — SCOPOLAMINE 1 MG/3DAYS TD PT72: 1.0000 | MEDICATED_PATCH | Freq: Once | TRANSDERMAL | Status: DC | PRN

## 2016-02-10 MED ORDER — CHLORHEXIDINE GLUCONATE 4 % EX LIQD: 60.0000 mL | Freq: Once | CUTANEOUS | Status: DC

## 2016-02-10 MED ORDER — MIDAZOLAM HCL 5 MG/5ML IJ SOLN
INTRAMUSCULAR | Status: DC | PRN
  Administered 2016-02-10: 2 mg via INTRAVENOUS

## 2016-02-10 MED ORDER — PROPOFOL 500 MG/50ML IV EMUL
INTRAVENOUS | Status: AC
  Filled 2016-02-10: qty 50

## 2016-02-10 MED ORDER — LIDOCAINE HCL (CARDIAC) 20 MG/ML IV SOLN
INTRAVENOUS | Status: DC | PRN
  Administered 2016-02-10: 100 mg via INTRAVENOUS

## 2016-02-10 MED ORDER — ONDANSETRON HCL 4 MG/2ML IJ SOLN
INTRAMUSCULAR | Status: AC
  Filled 2016-02-10: qty 2

## 2016-02-10 MED ORDER — EPHEDRINE SULFATE 50 MG/ML IJ SOLN
INTRAMUSCULAR | Status: DC | PRN
  Administered 2016-02-10 (×3): 10 mg via INTRAVENOUS

## 2016-02-10 MED ORDER — DEXTROSE 5 % IV SOLN
3.0000 g | INTRAVENOUS | Status: AC
  Administered 2016-02-10: 3 g via INTRAVENOUS

## 2016-02-10 MED ORDER — SUCCINYLCHOLINE CHLORIDE 20 MG/ML IJ SOLN
INTRAMUSCULAR | Status: DC | PRN
  Administered 2016-02-10: 170 mg via INTRAVENOUS

## 2016-02-10 MED ORDER — ONDANSETRON HCL 4 MG/2ML IJ SOLN
INTRAMUSCULAR | Status: DC | PRN
  Administered 2016-02-10: 4 mg via INTRAVENOUS

## 2016-02-10 MED ORDER — FENTANYL CITRATE (PF) 100 MCG/2ML IJ SOLN
50.0000 ug | INTRAMUSCULAR | Status: DC | PRN
  Administered 2016-02-10: 100 ug via INTRAVENOUS

## 2016-02-10 MED ORDER — PROPOFOL 10 MG/ML IV BOLUS
INTRAVENOUS | Status: DC | PRN
  Administered 2016-02-10: 300 mg via INTRAVENOUS

## 2016-02-10 SURGICAL SUPPLY — 71 items
BANDAGE ACE 4X5 VEL STRL LF (GAUZE/BANDAGES/DRESSINGS) ×3 IMPLANT
BANDAGE ACE 6X5 VEL STRL LF (GAUZE/BANDAGES/DRESSINGS) ×5 IMPLANT
BANDAGE ESMARK 6X9 LF (GAUZE/BANDAGES/DRESSINGS) ×1 IMPLANT
BLADE SURG 15 STRL LF DISP TIS (BLADE) ×2 IMPLANT
BLADE SURG 15 STRL SS (BLADE) ×6
BNDG CMPR 9X6 STRL LF SNTH (GAUZE/BANDAGES/DRESSINGS) ×1
BNDG ESMARK 6X9 LF (GAUZE/BANDAGES/DRESSINGS) ×3
BNDG GAUZE ELAST 4 BULKY (GAUZE/BANDAGES/DRESSINGS) ×3 IMPLANT
CLOSURE STERI-STRIP 1/2X4 (GAUZE/BANDAGES/DRESSINGS) ×1
CLSR STERI-STRIP ANTIMIC 1/2X4 (GAUZE/BANDAGES/DRESSINGS) ×1 IMPLANT
COVER BACK TABLE 60X90IN (DRAPES) ×3 IMPLANT
COVER MAYO STAND STRL (DRAPES) ×3 IMPLANT
CUFF TOURNIQUET SINGLE 34IN LL (TOURNIQUET CUFF) ×2 IMPLANT
DECANTER SPIKE VIAL GLASS SM (MISCELLANEOUS) IMPLANT
DRAPE EXTREMITY T 121X128X90 (DRAPE) ×3 IMPLANT
DRAPE U-SHAPE 47X51 STRL (DRAPES) ×3 IMPLANT
DRAPE U-SHAPE 76X120 STRL (DRAPES) ×3 IMPLANT
DRSG EMULSION OIL 3X3 NADH (GAUZE/BANDAGES/DRESSINGS) ×2 IMPLANT
DURAPREP 26ML APPLICATOR (WOUND CARE) ×3 IMPLANT
ELECT REM PT RETURN 9FT ADLT (ELECTROSURGICAL) ×3
ELECTRODE REM PT RTRN 9FT ADLT (ELECTROSURGICAL) ×1 IMPLANT
GAUZE SPONGE 4X4 12PLY STRL (GAUZE/BANDAGES/DRESSINGS) ×3 IMPLANT
GAUZE SPONGE 4X4 16PLY XRAY LF (GAUZE/BANDAGES/DRESSINGS) IMPLANT
GLOVE BIO SURGEON STRL SZ 6 (GLOVE) ×2 IMPLANT
GLOVE BIO SURGEON STRL SZ 6.5 (GLOVE) ×1 IMPLANT
GLOVE BIO SURGEON STRL SZ8 (GLOVE) ×6 IMPLANT
GLOVE BIO SURGEONS STRL SZ 6.5 (GLOVE) ×1
GLOVE BIOGEL PI IND STRL 6.5 (GLOVE) IMPLANT
GLOVE BIOGEL PI IND STRL 7.0 (GLOVE) IMPLANT
GLOVE BIOGEL PI IND STRL 8 (GLOVE) ×2 IMPLANT
GLOVE BIOGEL PI INDICATOR 6.5 (GLOVE) ×2
GLOVE BIOGEL PI INDICATOR 7.0 (GLOVE) ×2
GLOVE BIOGEL PI INDICATOR 8 (GLOVE) ×4
GOWN STRL REUS W/ TWL LRG LVL3 (GOWN DISPOSABLE) ×1 IMPLANT
GOWN STRL REUS W/ TWL XL LVL3 (GOWN DISPOSABLE) ×2 IMPLANT
GOWN STRL REUS W/TWL LRG LVL3 (GOWN DISPOSABLE) ×3
GOWN STRL REUS W/TWL XL LVL3 (GOWN DISPOSABLE) ×6
NDL 1/2 CIR CATGUT .05X1.09 (NEEDLE) IMPLANT
NEEDLE 1/2 CIR CATGUT .05X1.09 (NEEDLE) IMPLANT
NEEDLE HYPO 22GX1.5 SAFETY (NEEDLE) IMPLANT
NS IRRIG 1000ML POUR BTL (IV SOLUTION) ×3 IMPLANT
PACK BASIN DAY SURGERY FS (CUSTOM PROCEDURE TRAY) ×3 IMPLANT
PAD CAST 4YDX4 CTTN HI CHSV (CAST SUPPLIES) ×1 IMPLANT
PADDING CAST ABS 4INX4YD NS (CAST SUPPLIES) ×2
PADDING CAST ABS COTTON 4X4 ST (CAST SUPPLIES) IMPLANT
PADDING CAST COTTON 4X4 STRL (CAST SUPPLIES) ×3
PADDING CAST COTTON 6X4 STRL (CAST SUPPLIES) ×4 IMPLANT
PENCIL BUTTON HOLSTER BLD 10FT (ELECTRODE) ×3 IMPLANT
SLEEVE SCD COMPRESS KNEE MED (MISCELLANEOUS) IMPLANT
SPLINT FAST PLASTER 5X30 (CAST SUPPLIES) ×20
SPLINT PLASTER CAST FAST 5X30 (CAST SUPPLIES) ×5 IMPLANT
STAPLER VISISTAT 35W (STAPLE) IMPLANT
STOCKINETTE 4X48 STRL (DRAPES) ×3 IMPLANT
SUT ETHIBOND 2 OS 4 DA (SUTURE) IMPLANT
SUT FIBERWIRE #2 38 REV NDL BL (SUTURE) ×6
SUT FIBERWIRE #2 38 T-5 BLUE (SUTURE)
SUT MNCRL AB 3-0 PS2 18 (SUTURE) ×2 IMPLANT
SUT PROLENE 3 0 PS 2 (SUTURE) IMPLANT
SUT PROLENE 4 0 PS 2 18 (SUTURE) IMPLANT
SUT VIC AB 0 CT1 27 (SUTURE)
SUT VIC AB 0 CT1 27XBRD ANBCTR (SUTURE) IMPLANT
SUT VIC AB 0 SH 27 (SUTURE) ×2 IMPLANT
SUT VIC AB 2-0 SH 27 (SUTURE) ×3
SUT VIC AB 2-0 SH 27XBRD (SUTURE) IMPLANT
SUT VIC AB 3-0 FS2 27 (SUTURE) IMPLANT
SUTURE FIBERWR #2 38 T-5 BLUE (SUTURE) IMPLANT
SUTURE FIBERWR#2 38 REV NDL BL (SUTURE) IMPLANT
SYR BULB 3OZ (MISCELLANEOUS) ×3 IMPLANT
SYR CONTROL 10ML LL (SYRINGE) IMPLANT
TOWEL OR NON WOVEN STRL DISP B (DISPOSABLE) ×3 IMPLANT
UNDERPAD 30X30 (UNDERPADS AND DIAPERS) ×3 IMPLANT

## 2016-02-10 NOTE — Discharge Instructions (Signed)
°  Post Anesthesia Home Care Instructions ° °Activity: °Get plenty of rest for the remainder of the day. A responsible adult should stay with you for 24 hours following the procedure.  °For the next 24 hours, DO NOT: °-Drive a car °-Operate machinery °-Drink alcoholic beverages °-Take any medication unless instructed by your physician °-Make any legal decisions or sign important papers. ° °Meals: °Start with liquid foods such as gelatin or soup. Progress to regular foods as tolerated. Avoid greasy, spicy, heavy foods. If nausea and/or vomiting occur, drink only clear liquids until the nausea and/or vomiting subsides. Call your physician if vomiting continues. ° °Special Instructions/Symptoms: °Your throat may feel dry or sore from the anesthesia or the breathing tube placed in your throat during surgery. If this causes discomfort, gargle with warm salt water. The discomfort should disappear within 24 hours. ° °If you had a scopolamine patch placed behind your ear for the management of post- operative nausea and/or vomiting: ° °1. The medication in the patch is effective for 72 hours, after which it should be removed.  Wrap patch in a tissue and discard in the trash. Wash hands thoroughly with soap and water. °2. You may remove the patch earlier than 72 hours if you experience unpleasant side effects which may include dry mouth, dizziness or visual disturbances. °3. Avoid touching the patch. Wash your hands with soap and water after contact with the patch. °  °Regional Anesthesia Blocks ° °1. Numbness or the inability to move the "blocked" extremity may last from 3-48 hours after placement. The length of time depends on the medication injected and your individual response to the medication. If the numbness is not going away after 48 hours, call your surgeon. ° °2. The extremity that is blocked will need to be protected until the numbness is gone and the  Strength has returned. Because you cannot feel it, you will need  to take extra care to avoid injury. Because it may be weak, you may have difficulty moving it or using it. You may not know what position it is in without looking at it while the block is in effect. ° °3. For blocks in the legs and feet, returning to weight bearing and walking needs to be done carefully. You will need to wait until the numbness is entirely gone and the strength has returned. You should be able to move your leg and foot normally before you try and bear weight or walk. You will need someone to be with you when you first try to ensure you do not fall and possibly risk injury. ° °4. Bruising and tenderness at the needle site are common side effects and will resolve in a few days. ° °5. Persistent numbness or new problems with movement should be communicated to the surgeon or the [facility] ([phone])/  Surgery Center ([phone]). °

## 2016-02-10 NOTE — Interval H&P Note (Signed)
OK for surgery PD 

## 2016-02-10 NOTE — Transfer of Care (Signed)
Immediate Anesthesia Transfer of Care Note  Patient: Alan Sanchez  Procedure(s) Performed: Procedure(s): ACHILLES TENDON REPAIR (Right)  Patient Location: PACU  Anesthesia Type:General and Regional  Level of Consciousness: awake, alert  and oriented  Airway & Oxygen Therapy: Patient Spontanous Breathing and Patient connected to face mask oxygen  Post-op Assessment: Report given to RN and Post -op Vital signs reviewed and stable  Post vital signs: Reviewed and stable  Last Vitals:  Filed Vitals:   02/10/16 1240 02/10/16 1245  BP:  128/74  Pulse: 75 73  Temp:    Resp: 15 19    Last Pain:  Filed Vitals:   02/10/16 1249  PainSc: 4       Patients Stated Pain Goal: 1 (02/08/16 1045)  Complications: No apparent anesthesia complications

## 2016-02-10 NOTE — Progress Notes (Signed)
Assisted Dr. Katrinka BlazingSmith with right, ultrasound guided, popliteal block. Side rails up, monitors on throughout procedure. See vital signs in flow sheet. Tolerated Procedure well.

## 2016-02-10 NOTE — Anesthesia Preprocedure Evaluation (Signed)
Anesthesia Evaluation  Patient identified by MRN, date of birth, ID band Patient awake    Reviewed: Allergy & Precautions, NPO status , Patient's Chart, lab work & pertinent test results  Airway Mallampati: II  TM Distance: >3 FB Neck ROM: Full    Dental   Pulmonary    Pulmonary exam normal        Cardiovascular Normal cardiovascular exam     Neuro/Psych    GI/Hepatic   Endo/Other    Renal/GU      Musculoskeletal   Abdominal   Peds  Hematology   Anesthesia Other Findings   Reproductive/Obstetrics                             Anesthesia Physical Anesthesia Plan  ASA: II  Anesthesia Plan: General   Post-op Pain Management:    Induction: Intravenous  Airway Management Planned: Oral ETT  Additional Equipment:   Intra-op Plan:   Post-operative Plan: Extubation in OR  Informed Consent: I have reviewed the patients History and Physical, chart, labs and discussed the procedure including the risks, benefits and alternatives for the proposed anesthesia with the patient or authorized representative who has indicated his/her understanding and acceptance.     Plan Discussed with: CRNA, Anesthesiologist and Surgeon  Anesthesia Plan Comments:         Anesthesia Quick Evaluation

## 2016-02-10 NOTE — Anesthesia Procedure Notes (Addendum)
Anesthesia Regional Block:  Popliteal block  Pre-Anesthetic Checklist: ,, timeout performed, Correct Patient, Correct Site, Correct Laterality, Correct Procedure, Correct Position, site marked, Risks and benefits discussed,  Surgical consent,  Pre-op evaluation,  At surgeon's request and post-op pain management  Laterality: Right  Prep: chloraprep       Needles:   Needle Type: Stimulator Needle - 80          Additional Needles:  Procedures: ultrasound guided (picture in chart) Popliteal block Narrative:  Start time: 02/10/2016 1:00 PM End time: 02/10/2016 1:10 PM Injection made incrementally with aspirations every 5 mL.  Performed by: Personally  Anesthesiologist: Kate Sable  Additional Notes: Pt accepts procedure w/ risks. 30cc 0.5% Marcaine w/ mild difficulty visualizing. GES   Procedure Name: Intubation Date/Time: 02/10/2016 1:18 PM Performed by: Mechele Claude Pre-anesthesia Checklist: Patient identified, Emergency Drugs available, Suction available and Patient being monitored Patient Re-evaluated:Patient Re-evaluated prior to inductionOxygen Delivery Method: Circle System Utilized Preoxygenation: Pre-oxygenation with 100% oxygen Intubation Type: IV induction Ventilation: Mask ventilation without difficulty Laryngoscope Size: Mac and 4 Grade View: Grade I Tube type: Oral Tube size: 8.0 mm Number of attempts: 1 Airway Equipment and Method: Stylet and LTA kit utilized Placement Confirmation: ETT inserted through vocal cords under direct vision,  positive ETCO2 and breath sounds checked- equal and bilateral Secured at: 22 cm Tube secured with: Tape Dental Injury: Teeth and Oropharynx as per pre-operative assessment

## 2016-02-10 NOTE — Op Note (Signed)
PREOP DIAGNOSIS: right Achilles tendon rupture POSTOP DIAGNOSIS:  Same PROCEDURE:  right Achilles tendon repair ATTENDING SURGEON:  Marcene CorningPeter Shary Lamos MD ASSISTANT:  Elodia FlorenceAndrew Nida [facility] ANESTHESIA: general and block  INDICATION FOR PROCEDURE:  Alan Sanchez is a 45 y.o. male who experienced a pop with activity and subsequent loss of plantar flexion strength.  Exam was consistent with Achilles tendon rupture.  Operative and non-operative treatments were discussed with the patient and together we decided on operative management.  Informed operative consent was obtained after discussion of possible complications including reaction to anesthesia, infection, wound problems, and repeat rupture.  The importance of the post-operative rehabilitation was stressed extensively with the patient.  SUMMARY OF FINDINGS AND PROCEDURE:  Under the aforementioned anesthetic through a longitudinal posteromedial incision a complete rupture of the Achilles tendon was exposed and repaired using a central core stitch of #2 Fiberwire and a circumferential running over sew with #1 Vicryl.  At the end of the case the ankle was placed in a posterior splint with the ankle in slight plantarflexion.  DESCRIPTION OF PROCEDURE:  Alan Sanchez was taken to the OR suite where the above anesthesia was administered.  The patient was then placed in prone position with chest rolls used and all bony prominences appropriately padded.  The leg was prepped and draped in normal sterile fashion.  After the administration of kefzol pre-op antibiotic and appropriate time out  the leg was elevated, exsanguinated, and a tourniquet inflated about the calf.  A longitudinal posteromedial incision was made with dissection down to paratenon.  This structure was then cut longitudinally to expose the complete rupture of the tendon below.  The edges were freshened followed by placement of a Bunnell stitch of #2 Fiberwire with two limbs of the suture crossing the  rupture site.  This was tied reaproximating the tendon well.  I then used a #1 Vicryl to over sew the repair in running fashion.  I then repaired the paratenon in running fashion with 0 Vicryl suture and skin was closed in interrupted fashion with vertical mattress sutures of nylon.  The tourniquet was deflated and skin edges all became pink quickly.  Adaptic was applied followed by dry gauze and a posterior splint of plaster with the ankle in slight plantarflexion.  Estimated blood loss and interoperative fluids as well as tourniquet time can be obtained from anesthesia records.  DISPOSITION:  Alan Sanchez was taken to recovery room in stable condition.  Plans were to go home same day though overnight stay may be required for pain control.  Will follow up in 7-10 days in the office and if discharged home same day I will contact tonight by phone.  [name]  02/10/2016, 2:08 PM

## 2016-02-10 NOTE — Anesthesia Postprocedure Evaluation (Signed)
Anesthesia Post Note  Patient: Alan Sanchez  Procedure(s) Performed: Procedure(s) (LRB): ACHILLES TENDON REPAIR (Right)  Patient location during evaluation: PACU Anesthesia Type: General and Regional Level of consciousness: awake, oriented, sedated and patient cooperative Pain management: pain level controlled Vital Signs Assessment: post-procedure vital signs reviewed and stable Respiratory status: spontaneous breathing and respiratory function stable Cardiovascular status: stable Anesthetic complications: no    Last Vitals:  Filed Vitals:   02/10/16 1438 02/10/16 1445  BP: 146/100 153/96  Pulse: 90 90  Temp: 36.5 C   Resp: 17 15    Last Pain:  Filed Vitals:   02/10/16 1449  PainSc: Asleep        RLE Motor Response: No movement due to regional block (02/10/16 1445)        Alan Sanchez

## 2016-02-13 ENCOUNTER — Encounter (HOSPITAL_BASED_OUTPATIENT_CLINIC_OR_DEPARTMENT_OTHER): Payer: Self-pay | Admitting: Orthopaedic Surgery

## 2016-02-13 NOTE — Addendum Note (Signed)
Addendum  created 02/13/16 1157 by Lance CoonWesley Mardie Kellen, CRNA   Modules edited: Charges VN

## 2016-03-07 ENCOUNTER — Other Ambulatory Visit: Payer: Self-pay | Admitting: Emergency Medicine

## 2016-06-28 ENCOUNTER — Ambulatory Visit (INDEPENDENT_AMBULATORY_CARE_PROVIDER_SITE_OTHER): Payer: BC Managed Care – PPO

## 2016-06-28 ENCOUNTER — Ambulatory Visit (INDEPENDENT_AMBULATORY_CARE_PROVIDER_SITE_OTHER): Payer: BC Managed Care – PPO | Admitting: Physician Assistant

## 2016-06-28 VITALS — BP 124/82 | HR 91 | Temp 98.9°F | Resp 17 | Ht 69.0 in | Wt 283.0 lb

## 2016-06-28 DIAGNOSIS — M25551 Pain in right hip: Secondary | ICD-10-CM | POA: Diagnosis not present

## 2016-06-28 MED ORDER — NAPROXEN 500 MG PO TABS: 500.0000 mg | ORAL_TABLET | Freq: Two times a day (BID) | ORAL | 1 refills | Status: DC

## 2016-06-28 NOTE — Patient Instructions (Addendum)
  Take Tylenol 1000 mg every eight hours for pain control.  Add in Naprosyn as prescribed for uncontrolled pain.  Dg Hip Unilat W Or W/o Pelvis 2-3 Views Right  Result Date: 06/28/2016 CLINICAL DATA:  45 year old male with right hip pain and posterior buttock pain with no known injury. Initial encounter. EXAM: DG HIP (WITH OR WITHOUT PELVIS) 2-3V RIGHT COMPARISON:  Lumbar radiographs 04/17/2012. FINDINGS: Both femoral heads are normally located. Normal bone mineralization. Normal SI joints. Pelvis intact. Sacral ala appear intact. Grossly intact proximal left femur. Intact proximal right femur. Overall hip joint spaces appear stable since 2013 and within normal limits. There is mild acetabular subchondral sclerosis which is greater on the right. IMPRESSION: No acute osseous abnormality identified about the right hip per pelvis. 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 Electronically Signed   By: Odessa FlemingH  Hall M.D.   On: 06/28/2016 14:55     IF you received an x-ray today, you will receive an invoice from [facility]. Please contact [facility] at [phone] with questions or concerns regarding your invoice.   IF you received labwork today, you will receive an invoice from [facility]. Please contact [facility] at [phone] with questions or concerns regarding your invoice.   Our billing staff will not be able to assist you with questions regarding bills from these companies.  You will be contacted with the lab results as soon as they are available. The fastest way to get your results is to activate your My Chart account. Instructions are located on the last page of this paperwork. If you have not heard from us regarding the results in 2 weeks, please contact this office.

## 2016-06-28 NOTE — Progress Notes (Signed)
06/28/2016 2:58 PM   DOB: 11/25/1970 / MRN: [id number]  SUBJECTIVE:  Alan Sanchez is a 45 y.o. male presenting for right sided low back pain and he states this is his hip.  He points to the piriformis area and says "my hip hurts here."  Has a history of L4-L5 DJD and has a history of back pain, however reports this is different.  He denies paresthesia, weakness, bowel and bladder incontinence. The pain has been present for about 1 week and stared after doing lumbar/hip hyperextensions.    He is allergic to mold extract [trichophyton].   He  has a past medical history of Achilles tendon rupture.    He  reports that he has never smoked. He has never used smokeless tobacco. He reports that he drinks alcohol. He  has no sexual activity history on file. The patient  has a past surgical history that includes Finger amputation; Wisdom tooth extraction; and Achilles tendon surgery (Right, 02/10/2016).  His family history includes Diabetes in his mother; Hypertension in his brother and father.  Review of Systems  Constitutional: Negative for chills and fever.  Cardiovascular: Negative for chest pain.  Gastrointestinal: Negative for nausea.  Skin: Negative for itching and rash.  Neurological: Negative for dizziness and headaches.    The problem list and medications were reviewed and updated by myself where necessary and exist elsewhere in the encounter.   OBJECTIVE:  BP 124/82 (BP Location: Left Arm, Patient Position: Sitting, Cuff Size: Large)   Pulse 91   Temp 98.9 F (37.2 C) (Oral)   Resp 17   Ht 5\' 9"  (1.753 m)   Wt 283 lb (128.4 kg)   SpO2 96%   BMI 41.79 kg/m   Physical Exam  Constitutional: He is oriented to person, place, and time.  Cardiovascular: Normal rate and regular rhythm.   Pulmonary/Chest: Effort normal and breath sounds normal.  Musculoskeletal: He exhibits tenderness (about right piriformis, negative for acetabular grind.  Negative for hip proper/bursal  tenderness.  ROM normal at him. ).  Neurological: He is alert and oriented to person, place, and time.  Skin: Skin is warm.  Vitals reviewed.   No results found for this or any previous visit (from the past 72 hour(s)).  Dg Hip Unilat W Or W/o Pelvis 2-3 Views Right  Result Date: 06/28/2016 CLINICAL DATA:  45 year old male with right hip pain and posterior buttock pain with no known injury. Initial encounter. EXAM: DG HIP (WITH OR WITHOUT PELVIS) 2-3V RIGHT COMPARISON:  Lumbar radiographs 04/17/2012. FINDINGS: Both femoral heads are normally located. Normal bone mineralization. Normal SI joints. Pelvis intact. Sacral ala appear intact. Grossly intact proximal left femur. Intact proximal right femur. Overall hip joint spaces appear stable since 2013 and within normal limits. There is mild acetabular subchondral sclerosis which is greater on the right. IMPRESSION: No acute osseous abnormality identified about the right hip per pelvis. 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 Electronically Signed   By: Odessa Fleming M.D.   On: 06/28/2016 14:55    ASSESSMENT AND PLAN  Alan Sanchez was seen today for hip pain.  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  I have advised po meds and that he get into some water aerobics in hopes that he may lose some weight.  He will call if  he is not better so that I can refer to orthopedics.  -     DG HIP UNILAT W OR W/O PELVIS 2-3 VIEWS RIGHT; Future    The patient is advised to call or return to clinic if he does not see an improvement in symptoms, or to seek the care of the closest emergency department if he worsens with the above plan.   Deliah BostonMichael Clark, MHS, PA-C [facility] 06/28/2016 2:58 PM

## 2016-10-20 ENCOUNTER — Encounter: Payer: Self-pay | Admitting: Nurse Practitioner

## 2016-10-20 ENCOUNTER — Ambulatory Visit (INDEPENDENT_AMBULATORY_CARE_PROVIDER_SITE_OTHER): Payer: Self-pay | Admitting: Nurse Practitioner

## 2016-10-20 VITALS — BP 120/74 | Temp 98.2°F | Wt 274.0 lb

## 2016-10-20 DIAGNOSIS — L089 Local infection of the skin and subcutaneous tissue, unspecified: Secondary | ICD-10-CM

## 2016-10-20 MED ORDER — CEPHALEXIN 500 MG PO CAPS: 500.0000 mg | ORAL_CAPSULE | Freq: Two times a day (BID) | ORAL | 0 refills | Status: AC

## 2016-10-20 NOTE — Patient Instructions (Addendum)
Skin Abscess A skin abscess is an infected area on or under your skin that contains a collection of pus and other material. An abscess may also be called a furuncle, carbuncle, or boil. An abscess can occur in or on almost any part of your body. Some abscesses break open (rupture) on their own. Most continue to get worse unless they are treated. The infection can spread deeper into the body and eventually into your blood, which can make you feel ill. Treatment usually involves draining the abscess. What are the causes? An abscess occurs when germs, often bacteria, pass through your skin and cause an infection. This may be caused by:  A scrape or cut on your skin.  A puncture wound through your skin, including a needle injection.  Blocked oil or sweat glands.  Blocked and infected hair follicles.  A cyst that forms beneath your skin (sebaceous cyst) and becomes infected. What increases the risk? This condition is more likely to develop in people who:  Have a weak body defense system (immune system).  Have diabetes.  Have dry and irritated skin.  Get frequent injections or use illegal IV drugs.  Have a foreign body in a wound, such as a splinter.  Have problems with their lymph system or veins. What are the signs or symptoms? An abscess may start as a painful, firm bump under the skin. Over time, the abscess may get larger or become softer. Pus may appear at the top of the abscess, causing pressure and pain. It may eventually break through the skin and drain. Other symptoms include:  Redness.  Warmth.  Swelling.  Tenderness.  A sore on the skin. How is this diagnosed? This condition is diagnosed based on your medical history and a physical exam. A sample of pus may be taken from the abscess to find out what is causing the infection and what antibiotics can be used to treat it. You also may have:  Blood tests to look for signs of infection or spread of an infection to your  blood.  Imaging studies such as ultrasound, CT scan, or MRI if the abscess is deep. How is this treated? Small abscesses that drain on their own may not need treatment. Treatment for an abscess that does not rupture on its own may include:  Warm compresses applied to the area several times per day.  Incision and drainage. Your health care provider will make an incision to open the abscess and will remove pus and any foreign body or dead tissue. The incision area may be packed with gauze to keep it open for a few days while it heals.  Antibiotic medicines to treat infection. For a severe abscess, you may first get antibiotics through an IV and then change to oral antibiotics. Follow these instructions at home: Abscess Care   If you have an abscess that has not drained, place a warm, clean, wet washcloth over the abscess several times a day. Do this as told by your health care provider.  Follow instructions from your health care provider about how to take care of your abscess. Make sure you:  Cover the abscess with a bandage (dressing).  Change your dressing or gauze as told by your health care provider.  Wash your hands with soap and water before you change the dressing or gauze. If soap and water are not available, use hand sanitizer.  Check your abscess every day for signs of a worsening infection. Check for:  More redness, swelling, or   pain.  More fluid or blood.  Warmth.  More pus or a bad smell. Medicines   Take over-the-counter and prescription medicines only as told by your health care provider.  If you were prescribed an antibiotic medicine, take it as told by your health care provider. Do not stop taking the antibiotic even if you start to feel better. General instructions   To avoid spreading the infection:  Do not share personal care items, towels, or hot tubs with others.  Avoid making skin contact with other people.  Keep all follow-up visits as told by your  health care provider. This is important. Contact a health care provider if:  You have more redness, swelling, or pain around your abscess.  You have more fluid or blood coming from your abscess.  Your abscess feels warm to the touch.  You have more pus or a bad smell coming from your abscess.  You have a fever.  You have muscle aches.  You have chills or a general ill feeling. Get help right away if:  You have severe pain.  You see red streaks on your skin spreading away from the abscess. This information is not intended to replace advice given to you by your health care provider. Make sure you discuss any questions you have with your health care provider. Document Released: 06/27/2005 Document Revised: 05/13/2016 Document Reviewed: 07/27/2015 Elsevier Interactive Patient Education  2017 [employer]  

## 2016-10-20 NOTE — Progress Notes (Signed)
   Subjective:    Patient ID: Alan Sanchez, male    DOB: 12/10/1970, 46 y.o.   MRN: [id number]  The patient is a 46 y.o. Male that presents with swollen glands under his chin x 1 day.  Patient states he noticed this last pm around 9pm when he first noticed it and then more after he swallowed.  Patient denies fever, sore throat, aches, cough since onset.  Patient denies pain, but states this is uncomfortable for him.  Patient also denies trouble breathing and swallowing.  Patient does wear a beard and is not sure if this is an ingrown hair.  The patient denies heart, lung, kidney, liver disease.        Review of Systems  Constitutional: Negative.   HENT:       Swelling under chin  Respiratory: Negative.   Cardiovascular: Negative.   Skin:       Swelling under chin       Objective:   Physical Exam  Constitutional: He is oriented to person, place, and time. He appears well-developed and well-nourished. No distress.  HENT:  Head: Normocephalic and atraumatic.  Right Ear: External ear normal.  Left Ear: External ear normal.  Mouth/Throat: Oropharynx is clear and moist.  Eyes: Conjunctivae and EOM are normal. Pupils are equal, round, and reactive to light.  Neck: Normal range of motion. Neck supple. No thyromegaly present.  Hardened area under chin, approximately 2cm in diameter.  No warmth or discharge. FROM to cervical spine.  Area does not impeded airway, superficial induration.  Cardiovascular: Normal rate and regular rhythm.   Pulmonary/Chest: Effort normal and breath sounds normal.  Lymphadenopathy:    He has cervical adenopathy (right cervical adenopathy).  Neurological: He is alert and oriented to person, place, and time.  Skin: Skin is warm and dry.  Psychiatric: He has a normal mood and affect. His behavior is normal. Judgment and thought content normal.  Vitals reviewed.         Assessment & Plan:  Skin Infection.  Patient given discharge instructions for same.   Patient instructed to use warm compress to area 4 times daily until resolved.  Given prescription for antibiotic.  Patient instructed to go to ER if he develops difficulty breathing, swallowing or other concerns.  Patient will follow up Monday or Tuesday if needed. Patient verbalizes understanding.

## 2016-10-22 ENCOUNTER — Telehealth: Payer: Self-pay | Admitting: Nurse Practitioner

## 2016-10-22 NOTE — Telephone Encounter (Signed)
Called patient to f/u.  Reached vm, left message. 

## 2016-10-23 ENCOUNTER — Telehealth: Payer: Self-pay | Admitting: Nurse Practitioner

## 2016-10-23 NOTE — Telephone Encounter (Signed)
Called patient to f/u.  Reached vm, left message. 

## 2016-12-07 ENCOUNTER — Ambulatory Visit (INDEPENDENT_AMBULATORY_CARE_PROVIDER_SITE_OTHER): Payer: BC Managed Care – PPO

## 2016-12-07 ENCOUNTER — Ambulatory Visit (INDEPENDENT_AMBULATORY_CARE_PROVIDER_SITE_OTHER): Payer: BC Managed Care – PPO | Admitting: Physician Assistant

## 2016-12-07 VITALS — BP 112/70 | HR 66 | Temp 97.5°F | Resp 18 | Ht 69.0 in | Wt 277.8 lb

## 2016-12-07 DIAGNOSIS — M25512 Pain in left shoulder: Secondary | ICD-10-CM

## 2016-12-07 MED ORDER — MELOXICAM 15 MG PO TABS: 15.0000 mg | ORAL_TABLET | Freq: Every day | ORAL | 0 refills | Status: DC

## 2016-12-07 MED ORDER — CYCLOBENZAPRINE HCL 10 MG PO TABS: 5.0000 mg | ORAL_TABLET | Freq: Three times a day (TID) | ORAL | 0 refills | Status: DC | PRN

## 2016-12-07 NOTE — Patient Instructions (Addendum)
PLEASE ice the shoulder three times per day, for 15 seconds.  Shoulder Exercises Ask your health care provider which exercises are safe for you. Do exercises exactly as told by your health care provider and adjust them as directed. It is normal to feel mild stretching, pulling, tightness, or discomfort as you do these exercises, but you should stop right away if you feel sudden pain or your pain gets worse.Do not begin these exercises until told by your health care provider. RANGE OF MOTION EXERCISES  These exercises warm up your muscles and joints and improve the movement and flexibility of your shoulder. These exercises also help to relieve pain, numbness, and tingling. These exercises involve stretching your injured shoulder directly. Exercise A: Pendulum   1. Stand near a wall or a surface that you can hold onto for balance. 2. Bend at the waist and let your left / right arm hang straight down. Use your other arm to support you. Keep your back straight and do not lock your knees. 3. Relax your left / right arm and shoulder muscles, and move your hips and your trunk so your left / right arm swings freely. Your arm should swing because of the motion of your body, not because you are using your arm or shoulder muscles. 4. Keep moving your body so your arm swings in the following directions, as told by your health care provider:  Side to side.  Forward and backward.  In clockwise and counterclockwise circles. 5. Continue each motion for __________ seconds, or for as long as told by your health care provider. 6. Slowly return to the starting position. Repeat __________ times. Complete this exercise __________ times a day. Exercise B:Flexion, Standing   1. Stand and hold a broomstick, a cane, or a similar object. Place your hands a little more than shoulder-width apart on the object. Your left / right hand should be palm-up, and your other hand should be palm-down. 2. Keep your elbow straight  and keep your shoulder muscles relaxed. Push the stick down with your healthy arm to raise your left / right arm in front of your body, and then over your head until you feel a stretch in your shoulder.  Avoid shrugging your shoulder while you raise your arm. Keep your shoulder blade tucked down toward the middle of your back. 3. Hold for __________ seconds. 4. Slowly return to the starting position. Repeat __________ times. Complete this exercise __________ times a day. Exercise C: Abduction, Standing  1. Stand and hold a broomstick, a cane, or a similar object. Place your hands a little more than shoulder-width apart on the object. Your left / right hand should be palm-up, and your other hand should be palm-down. 2. While keeping your elbow straight and your shoulder muscles relaxed, push the stick across your body toward your left / right side. Raise your left / right arm to the side of your body and then over your head until you feel a stretch in your shoulder.  Do not raise your arm above shoulder height, unless your health care provider tells you to do that.  Avoid shrugging your shoulder while you raise your arm. Keep your shoulder blade tucked down toward the middle of your back. 3. Hold for __________ seconds. 4. Slowly return to the starting position. Repeat __________ times. Complete this exercise __________ times a day. Exercise D:Internal Rotation   1. Place your left / right hand behind your back, palm-up. 2. Use your other hand to  dangle an exercise band, a towel, or a similar object over your shoulder. Grasp the band with your left / right hand so you are holding onto both ends. 3. Gently pull up on the band until you feel a stretch in the front of your left / right shoulder.  Avoid shrugging your shoulder while you raise your arm. Keep your shoulder blade tucked down toward the middle of your back. 4. Hold for __________ seconds. 5. Release the stretch by letting go of the  band and lowering your hands. Repeat __________ times. Complete this exercise __________ times a day. STRETCHING EXERCISES  These exercises warm up your muscles and joints and improve the movement and flexibility of your shoulder. These exercises also help to relieve pain, numbness, and tingling. These exercises are done using your healthy shoulder to help stretch the muscles of your injured shoulder. Exercise E: Research officer, political party (External Rotation and Abduction)   1. Stand in a doorway with one of your feet slightly in front of the other. This is called a staggered stance. If you cannot reach your forearms to the door frame, stand facing a corner of a room. 2. Choose one of the following positions as told by your health care provider:  Place your hands and forearms on the door frame above your head.  Place your hands and forearms on the door frame at the height of your head.  Place your hands on the door frame at the height of your elbows. 3. Slowly move your weight onto your front foot until you feel a stretch across your chest and in the front of your shoulders. Keep your head and chest upright and keep your abdominal muscles tight. 4. Hold for __________ seconds. 5. To release the stretch, shift your weight to your back foot. Repeat __________ times. Complete this stretch __________ times a day. Exercise F:Extension, Standing  1. Stand and hold a broomstick, a cane, or a similar object behind your back.  Your hands should be a little wider than shoulder-width apart.  Your palms should face away from your back. 2. Keeping your elbows straight and keeping your shoulder muscles relaxed, move the stick away from your body until you feel a stretch in your shoulder.  Avoid shrugging your shoulders while you move the stick. Keep your shoulder blade tucked down toward the middle of your back. 3. Hold for __________ seconds. 4. Slowly return to the starting position. Repeat __________ times.  Complete this exercise __________ times a day. STRENGTHENING EXERCISES  These exercises build strength and endurance in your shoulder. Endurance is the ability to use your muscles for a long time, even after they get tired. Exercise G:External Rotation   1. Sit in a stable chair without armrests. 2. Secure an exercise band at elbow height on your left / right side. 3. Place a soft object, such as a folded towel or a small pillow, between your left / right upper arm and your body to move your elbow a few inches away (about 10 cm) from your side. 4. Hold the end of the band so it is tight and there is no slack. 5. Keeping your elbow pressed against the soft object, move your left / right forearm out, away from your abdomen. Keep your body steady so only your forearm moves. 6. Hold for __________ seconds. 7. Slowly return to the starting position. Repeat __________ times. Complete this exercise __________ times a day. Exercise H:Shoulder Abduction   1. Sit in a stable chair  without armrests, or stand. 2. Hold a __________ weight in your left / right hand, or hold an exercise band with both hands. 3. Start with your arms straight down and your left / right palm facing in, toward your body. 4. Slowly lift your left / right hand out to your side. Do not lift your hand above shoulder height unless your health care provider tells you that this is safe.  Keep your arms straight.  Avoid shrugging your shoulder while you do this movement. Keep your shoulder blade tucked down toward the middle of your back. 5. Hold for __________ seconds. 6. Slowly lower your arm, and return to the starting position. Repeat __________ times. Complete this exercise __________ times a day. Exercise I:Shoulder Extension  1. Sit in a stable chair without armrests, or stand. 2. Secure an exercise band to a stable object in front of you where it is at shoulder height. 3. Hold one end of the exercise band in each hand.  Your palms should face each other. 4. Straighten your elbows and lift your hands up to shoulder height. 5. Step back, away from the secured end of the exercise band, until the band is tight and there is no slack. 6. Squeeze your shoulder blades together as you pull your hands down to the sides of your thighs. Stop when your hands are straight down by your sides. Do not let your hands go behind your body. 7. Hold for __________ seconds. 8. Slowly return to the starting position. Repeat __________ times. Complete this exercise __________ times a day. Exercise J:Standing Shoulder Row  1. Sit in a stable chair without armrests, or stand. 2. Secure an exercise band to a stable object in front of you so it is at waist height. 3. Hold one end of the exercise band in each hand. Your palms should be in a thumbs-up position. 4. Bend each of your elbows to an "L" shape (about 90 degrees) and keep your upper arms at your sides. 5. Step back until the band is tight and there is no slack. 6. Slowly pull your elbows back behind you. 7. Hold for __________ seconds. 8. Slowly return to the starting position. Repeat __________ times. Complete this exercise __________ times a day. Exercise K:Shoulder Press-Ups   1. Sit in a stable chair that has armrests. Sit upright, with your feet flat on the floor. 2. Put your hands on the armrests so your elbows are bent and your fingers are pointing forward. Your hands should be about even with the sides of your body. 3. Push down on the armrests and use your arms to lift yourself off of the chair. Straighten your elbows and lift yourself up as much as you comfortably can.  Move your shoulder blades down, and avoid letting your shoulders move up toward your ears.  Keep your feet on the ground. As you get stronger, your feet should support less of your body weight as you lift yourself up. 4. Hold for __________ seconds. 5. Slowly lower yourself back into the  chair. Repeat __________ times. Complete this exercise __________ times a day. Exercise L: Wall Push-Ups   1. Stand so you are facing a stable wall. Your feet should be about one arm-length away from the wall. 2. Lean forward and place your palms on the wall at shoulder height. 3. Keep your feet flat on the floor as you bend your elbows and lean forward toward the wall. 4. Hold for __________ seconds. 5. Straighten your  elbows to push yourself back to the starting position. Repeat __________ times. Complete this exercise __________ times a day. This information is not intended to replace advice given to you by your health care provider. Make sure you discuss any questions you have with your health care provider. Document Released: 08/01/2005 Document Revised: 06/11/2016 Document Reviewed: 05/29/2015 Elsevier Interactive Patient Education  2017 [employer]     IF you received an x-ray today, you will receive an invoice from [facility]. Please contact [facility] at [phone] with questions or concerns regarding your invoice.   IF you received labwork today, you will receive an invoice from [facility]. Please contact [facility] at [phone] with questions or concerns regarding your invoice.   Our billing staff will not be able to assist you with questions regarding bills from these companies.  You will be contacted with the lab results as soon as they are available. The fastest way to get your results is to activate your My Chart account. Instructions are located on the last page of this paperwork. If you have not heard from us regarding the results in 2 weeks, please contact this office.

## 2016-12-07 NOTE — Progress Notes (Signed)
[facility] [address] [place] [address] [phone]  Date:  12/07/2016   Name:  Nevada Mullett   DOB:  1971-01-11   MRN:  [id number]  PCP:  No PCP Per Patient    History of Present Illness:  Fransisco Messmer is a 46 y.o. male patient who presents to [facility] with left sided shoulder pain.   Swimming for exercise., more than one week ago--feels like it needs to be popped.  He tries to swim and bench press.   2 weeks ago, he was performing butterfly resistance and developed pain.  No numbness or tingling.  Pain is more at the front of the shoulder.  Pain is more localized.  No crackling or popping.  No medications, or thermotherapy.     Patient Active Problem List   Diagnosis Date Noted  . Bilateral knee pain 04/23/2014  . Right elbow pain 04/23/2014  . Left shoulder pain 04/23/2014  . OBESITY, UNSPECIFIED 12/09/2009  . PALPITATIONS 12/09/2009  . ABNORMAL ELECTROCARDIOGRAM 12/09/2009    Past Medical History:  Diagnosis Date  . Achilles tendon rupture    right    Past Surgical History:  Procedure Laterality Date  . ACHILLES TENDON SURGERY Right 02/10/2016   Procedure: ACHILLES TENDON REPAIR;  Surgeon: Marcene Corning, MD;  Location: [facility];  Service: Orthopedics;  Laterality: Right;  . FINGER AMPUTATION     Tip of Right middle finger  . WISDOM TOOTH EXTRACTION      Social History  Substance Use Topics  . Smoking status: Never Smoker  . Smokeless tobacco: Never Used  . Alcohol use 0.0 oz/week     Comment: Rarely    Family History  Problem Relation Age of Onset  . Hypertension Father   . Arrhythmia Neg Hx   . Heart disease Neg Hx     No early  . Cardiomyopathy Neg Hx     Nor sudden cardiac death  . Hypertension Brother   . Diabetes Mother     Allergies  Allergen Reactions  . Mold Extract [Trichophyton]     Medication list has been reviewed and updated.  Current Outpatient Prescriptions on File Prior to Visit   Medication Sig Dispense Refill  . cholecalciferol (VITAMIN D) 1000 units tablet Take 1,000 Units by mouth daily.    Marland Kitchen glucosamine-chondroitin 500-400 MG tablet Take 1 tablet by mouth 3 (three) times daily.    . Multiple Vitamin (MULTIVITAMIN) capsule Take 1 capsule by mouth daily.    . Omega-3 Fatty Acids (FISH OIL) 1000 MG CAPS Take by mouth.     No current facility-administered medications on file prior to visit.     ROS ROS otherwise unremarkable unless listed above  Physical Examination: BP 112/70   Pulse 66   Temp 97.5 F (36.4 C) (Oral)   Resp 18   Ht 5\' 9"  (1.753 m)   Wt 277 lb 12.8 oz (126 kg)   SpO2 98%   BMI 41.02 kg/m  Ideal Body Weight: Weight in (lb) to have BMI = 25: 168.9  Physical Exam  Constitutional: He is oriented to person, place, and time. He appears well-developed and well-nourished. No distress.  HENT:  Head: Normocephalic and atraumatic.  Eyes: Conjunctivae and EOM are normal. Pupils are equal, round, and reactive to light.  Cardiovascular: Normal rate.   Pulmonary/Chest: Effort normal. No respiratory distress.  Musculoskeletal:       Left shoulder: He exhibits decreased range of motion (with external rotation)  and tenderness (tender at the ac joint). He exhibits no bony tenderness, no swelling and normal pulse.  Cervical rom normal, however can ignite pain with right lateral deviation.  Negative hawkins/neer  Neurological: He is alert and oriented to person, place, and time.  Skin: Skin is warm and dry. He is not diaphoretic.  Psychiatric: He has a normal mood and affect. His behavior is normal.   Dg Shoulder Left  Result Date: 12/07/2016 CLINICAL DATA:  Left shoulder pain EXAM: LEFT SHOULDER - 2+ VIEW COMPARISON:  None. FINDINGS: There is no evidence of fracture or dislocation. There is no evidence of arthropathy or other focal bone abnormality. Soft tissues are unremarkable. IMPRESSION: Negative. Electronically Signed   By: Marlan Palauharles  Clark M.D.    On: 12/07/2016 13:33     Assessment and Plan: Pollyann GlenChristopher Boye is a 46 y.o. male who is here today for left shoulder pain.  Possible AC joint inflammation, impingement nerve, muscle spasm. Advised that we RICE.  Introducing shoulder stretches, and ice directly after.  Anti-inflammatory given. If shoulder pain does not improve after 2 weeks, will refer.  Acute pain of left shoulder - Plan: DG Shoulder Left, meloxicam (MOBIC) 15 MG tablet, cyclobenzaprine (FLEXERIL) 10 MG tablet  Arthralgia of left acromioclavicular joint - Plan: meloxicam (MOBIC) 15 MG tablet  Trena PlattStephanie English, PA-C [facility] 3/17/20188:54 AM

## 2017-01-03 ENCOUNTER — Other Ambulatory Visit: Payer: Self-pay | Admitting: Physician Assistant

## 2017-01-03 DIAGNOSIS — M25512 Pain in left shoulder: Secondary | ICD-10-CM

## 2017-01-03 NOTE — Telephone Encounter (Signed)
12/07/16 last ov or refill

## 2017-08-01 ENCOUNTER — Ambulatory Visit (INDEPENDENT_AMBULATORY_CARE_PROVIDER_SITE_OTHER): Payer: BC Managed Care – PPO | Admitting: Physician Assistant

## 2017-08-01 VITALS — BP 126/78 | HR 98 | Temp 98.2°F | Resp 18 | Ht 69.0 in | Wt 272.0 lb

## 2017-08-01 DIAGNOSIS — Z713 Dietary counseling and surveillance: Secondary | ICD-10-CM | POA: Diagnosis not present

## 2017-08-01 NOTE — Progress Notes (Signed)
PRIMARY CARE AT [facility] [address] [place] [address] [phone]  Date:  08/01/2017   Name:  Kodi Steil   DOB:  Sep 26, 1971   MRN:  [id number]  PCP:  Patient, No Pcp Per    History of Present Illness:  Arash Karstens is a 46 y.o. male patient who presents to PCP with  Chief Complaint  Patient presents with  . Weight Loss     Patient is here today because he is concerned that he can not loose weight despite his many dietary and lifestyle changes.  --blueberry spinage, fiber powder, coconut water, and cutting back on weekends.  --lunch: varies (entree and 2 sides--from campus cafeteria).   --dinner:  --he is working out 4 times per week (cardio, weightlifting, and swimming).  He uses the elliptical (15-30 minutes and walking).  And weightlifting.  2 miles, walking and jogging.    --he is drinking sweet tea occasionally.  He will have a burger occasionally.  He will have breaded food occasionally.  --wife is seeing a nutritionist where she has programmed drinks and diet is given.  He would like to pursue this.  [facility]  Patient Active Problem List   Diagnosis Date Noted  . Bilateral knee pain 04/23/2014  . Right elbow pain 04/23/2014  . Left shoulder pain 04/23/2014  . OBESITY, UNSPECIFIED 12/09/2009  . PALPITATIONS 12/09/2009  . ABNORMAL ELECTROCARDIOGRAM 12/09/2009    Past Medical History:  Diagnosis Date  . Achilles tendon rupture    right    Past Surgical History:  Procedure Laterality Date  . FINGER AMPUTATION     Tip of Right middle finger  . WISDOM TOOTH EXTRACTION      Social History   Tobacco Use  . Smoking status: Never Smoker  . Smokeless tobacco: Never Used  Substance Use Topics  . Alcohol use: Yes    Alcohol/week: 0.0 oz    Comment: Rarely  . Drug use: Not on file    Family History  Problem Relation Age of Onset  . Hypertension Father   . Arrhythmia Neg Hx   . Heart disease Neg Hx         No early  . Cardiomyopathy Neg Hx        Nor sudden cardiac death  . Hypertension Brother   . Diabetes Mother     Allergies  Allergen Reactions  . Mold Extract [Trichophyton]     Medication list has been reviewed and updated.  Current Outpatient Medications on File Prior to Visit  Medication Sig Dispense Refill  . cholecalciferol (VITAMIN D) 1000 units tablet Take 1,000 Units by mouth daily.    . cyclobenzaprine (FLEXERIL) 10 MG tablet Take 0.5-1 tablets (5-10 mg total) by mouth 3 (three) times daily as needed for muscle spasms. 30 tablet 0  . glucosamine-chondroitin 500-400 MG tablet Take 1 tablet by mouth 3 (three) times daily.    . meloxicam (MOBIC) 15 MG tablet Take 1 tablet (15 mg total) by mouth daily. 30 tablet 0  . Multiple Vitamin (MULTIVITAMIN) capsule Take 1 capsule by mouth daily.    . Omega-3 Fatty Acids (FISH OIL) 1000 MG CAPS Take by mouth.     No current facility-administered medications on file prior to visit.     ROS ROS otherwise unremarkable unless listed above.  Physical Examination: BP 126/78   Pulse 98   Temp 98.2 F (36.8 C) (Oral)   Resp 18   Ht  5\' 9"  (1.753 m)   Wt 272 lb (123.4 kg)   SpO2 98%   BMI 40.17 kg/m  Ideal Body Weight: Weight in (lb) to have BMI = 25: 168.9  Physical Exam  Constitutional: He is oriented to person, place, and time. He appears well-developed and well-nourished. No distress.  HENT:  Head: Normocephalic and atraumatic.  Eyes: Conjunctivae and EOM are normal. Pupils are equal, round, and reactive to light.  Cardiovascular: Normal rate.  Pulmonary/Chest: Effort normal. No respiratory distress.  Neurological: He is alert and oriented to person, place, and time.  Skin: Skin is warm and dry. He is not diaphoretic.  Psychiatric: He has a normal mood and affect. His behavior is normal.     Assessment and Plan: Pollyann GlenChristopher Dentinger is a 46 y.o. male who is here today for cc of  Chief Complaint  Patient presents with   . Weight Loss   Encounter for weight loss counseling - Plan: Basic metabolic panel, TSH  Severe obesity (BMI >= 40) (HCC) - Plan: Basic metabolic panel, TSH  Trena PlattStephanie Arrianna Catala, PA-C [facility] 11/6/201810:59 AM

## 2017-08-01 NOTE — Patient Instructions (Addendum)
I would like you to continue [employer] on your phone, and maintain the dietary restrictions that they put forth.  Do not go over.  Place your caloric intake want, and go from there.   I will have your lab results. Please give me the name of the nutritionist/weight loss manager recommended for referral  Exercising to Lose Weight Exercising can help you to lose weight. In order to lose weight through exercise, you need to do vigorous-intensity exercise. You can tell that you are exercising with vigorous intensity if you are breathing very hard and fast and cannot hold a conversation while exercising. Moderate-intensity exercise helps to maintain your current weight. You can tell that you are exercising at a moderate level if you have a higher heart rate and faster breathing, but you are still able to hold a conversation. How often should I exercise? Choose an activity that you enjoy and set realistic goals. Your health care provider can help you to make an activity plan that works for you. Exercise regularly as directed by your health care provider. This may include:  Doing resistance training twice each week, such as: ? Push-ups. ? Sit-ups. ? Lifting weights. ? Using resistance bands.  Doing a given intensity of exercise for a given amount of time. Choose from these options: ? 150 minutes of moderate-intensity exercise every week. ? 75 minutes of vigorous-intensity exercise every week. ? A mix of moderate-intensity and vigorous-intensity exercise every week.  Children, pregnant women, people who are out of shape, people who are overweight, and older adults may need to consult a health care provider for individual recommendations. If you have any sort of medical condition, be sure to consult your health care provider before starting a new exercise program. What are some activities that can help me to lose weight?  Walking at a rate of at least 4.5 miles an hour.  Jogging or running at a  rate of 5 miles per hour.  Biking at a rate of at least 10 miles per hour.  Lap swimming.  Roller-skating or in-line skating.  Cross-country skiing.  Vigorous competitive sports, such as football, basketball, and soccer.  Jumping rope.  Aerobic dancing. How can I be more active in my day-to-day activities?  Use the stairs instead of the elevator.  Take a walk during your lunch break.  If you drive, park your car farther away from work or school.  If you take public transportation, get off one stop early and walk the rest of the way.  Make all of your phone calls while standing up and walking around.  Get up, stretch, and walk around every 30 minutes throughout the day. What guidelines should I follow while exercising?  Do not exercise so much that you hurt yourself, feel dizzy, or get very short of breath.  Consult your health care provider prior to starting a new exercise program.  Wear comfortable clothes and shoes with good support.  Drink plenty of water while you exercise to prevent dehydration or heat stroke. Body water is lost during exercise and must be replaced.  Work out until you breathe faster and your heart beats faster. This information is not intended to replace advice given to you by your health care provider. Make sure you discuss any questions you have with your health care provider. Document Released: 10/20/2010 Document Revised: 02/23/2016 Document Reviewed: 02/18/2014 Elsevier Interactive Patient Education  [employer].     IF you received an x-ray today, you will receive an  [facility]. Please contact [facility] at [phone] with questions or concerns regarding your invoice.   IF you received labwork today, you will receive an invoice from [facility]. Please contact [facility] at [phone] with questions or concerns regarding your invoice.   Our billing staff will not be able to assist you with questions  regarding bills from these companies.  You will be contacted with the lab results as soon as they are available. The fastest way to get your results is to activate your My Chart account. Instructions are located on the last page of this paperwork. If you have not heard from us regarding the results in 2 weeks, please contact this office.

## 2017-08-02 LAB — BASIC METABOLIC PANEL
BUN / CREAT RATIO: 11 (ref 9–20)
BUN: 14 mg/dL (ref 6–24)
CHLORIDE: 99 mmol/L (ref 96–106)
CO2: 27 mmol/L (ref 20–29)
Calcium: 10.4 mg/dL — ABNORMAL HIGH (ref 8.7–10.2)
Creatinine, Ser: 1.27 mg/dL (ref 0.76–1.27)
GFR calc Af Amer: 77 mL/min/{1.73_m2} (ref >59)
GFR calc non Af Amer: 67 mL/min/{1.73_m2} (ref >59)
GLUCOSE: 96 mg/dL (ref 65–99)
Potassium: 4.8 mmol/L (ref 3.5–5.2)
SODIUM: 140 mmol/L (ref 134–144)

## 2017-08-02 LAB — TSH: TSH: 1.16 u[IU]/mL (ref 0.450–4.500)

## 2018-01-07 ENCOUNTER — Encounter: Payer: Self-pay | Admitting: Physician Assistant

## 2018-01-07 ENCOUNTER — Ambulatory Visit: Payer: BC Managed Care – PPO | Admitting: Physician Assistant

## 2018-01-07 ENCOUNTER — Other Ambulatory Visit: Payer: Self-pay

## 2018-01-07 VITALS — BP 120/82 | HR 88 | Temp 98.7°F | Ht 69.5 in | Wt 256.0 lb

## 2018-01-07 DIAGNOSIS — M546 Pain in thoracic spine: Secondary | ICD-10-CM

## 2018-01-07 LAB — POCT URINALYSIS DIP (MANUAL ENTRY)
BILIRUBIN UA: NEGATIVE
BILIRUBIN UA: NEGATIVE mg/dL
Blood, UA: NEGATIVE
GLUCOSE UA: NEGATIVE mg/dL
LEUKOCYTES UA: NEGATIVE
Nitrite, UA: NEGATIVE
Protein Ur, POC: NEGATIVE mg/dL
SPEC GRAV UA: 1.025 (ref 1.010–1.025)
Urobilinogen, UA: 0.2 E.U./dL
pH, UA: 5.5 (ref 5.0–8.0)

## 2018-01-07 MED ORDER — NAPROXEN 500 MG PO TABS: 500.0000 mg | ORAL_TABLET | Freq: Two times a day (BID) | ORAL | 0 refills | Status: AC

## 2018-01-07 MED ORDER — CYCLOBENZAPRINE HCL 5 MG PO TABS
5.0000 mg | ORAL_TABLET | Freq: Three times a day (TID) | ORAL | 0 refills | Status: DC | PRN
Start: 2018-01-07 — End: 2020-05-16

## 2018-01-07 NOTE — Patient Instructions (Addendum)
I recommend resting today. However, tomorrow I would begin walking and moving around as much as tolerated. Begin stretching in a couple of days. The worse thing you can do for low back pain is lie in bed all day or sit down all day. Use medications as needed.   Just to know, flexeril can cause side effects that may impair your thinking or reactions. Be careful if you drive or do anything that requires you to be awake and alert. void drinking alcohol, which can increase some of the side effects of Flexeril.  NSAIDs like naproxen have common side effects of heartburn, stomach pain, indigestion, and headache. Could lead to renal insufficiency, stroke, or GI bleed if taken excess amounts outside of what is recommended on label long term.    You should avoid heavy lifting or strenuous repetitive activity to prevent recurrence of event. Experiment with both ice and heat and choose whichever feels best for you.  Use heat pad or ice pack, do not apply directly to skin, use barrier such as towel over the skin. Leave on for 15-20 minutes, 3-4 times a day.  Please perform exercises below. Stretches are to be performed for 2 sets, holding 10-15 seconds each. Recommended to perform this rehab twice daily within pain tolerance for 2 weeks.   FLEXION RANGE OF MOTION AND STRETCHING EXERCISES: STRETCH - Flexion, Single Knee to Chest   Lie on a firm bed or floor with both legs extended in front of you.  Keeping one leg in contact with the floor, bring your opposite knee to your chest. Hold your leg in place by either grabbing behind your thigh or at your knee.  Pull until you feel a gentle stretch in your lower back.   Slowly release your grasp and repeat the exercise with the opposite side.  STRETCH - Flexion, Double Knee to Chest   Lie on a firm bed or floor with both legs extended in front of you.  Keeping one leg in contact with the floor, bring your opposite knee to your chest.  Tense your stomach  muscles to support your back and then lift your other knee to your chest. Hold your legs in place by either grabbing behind your thighs or at your knees.  Pull both knees toward your chest until you feel a gentle stretch in your lower back.   Tense your stomach muscles and slowly return one leg at a time to the floor.  STRETCH - Low Trunk Rotation  Lie on a firm bed or floor. Keeping your legs in front of you, bend your knees so they are both pointed toward the ceiling and your feet are flat on the floor.  Extend your arms out to the side. This will stabilize your upper body by keeping your shoulders in contact with the floor.  Gently and slowly drop both knees together to one side until you feel a gentle stretch in your lower back.   Tense your stomach muscles to support your lower back as you bring your knees back to the starting position. Repeat the exercise to the other side.   EXTENSION RANGE OF MOTION AND FLEXIBILITY EXERCISES: STRETCH - Extension, Prone on Elbows   Lie on your stomach on the floor, a bed will be too soft. Place your palms about shoulder width apart and at the height of your head.  Place your elbows under your shoulders. If this is too painful, stack pillows under your chest.  Allow your body to relax  so that your hips drop lower and make contact more completely with the floor.  Slowly return to lying flat on the floor.  RANGE OF MOTION - Extension, Prone Press Ups  Lie on your stomach on the floor, a bed will be too soft. Place your palms about shoulder width apart and at the height of your head.  Keeping your back as relaxed as possible, slowly straighten your elbows while keeping your hips on the floor. You may adjust the placement of your hands to maximize your comfort. As you gain motion, your hands will come more underneath your shoulders.  Slowly return to lying flat on the floor.  RANGE OF MOTION- Quadruped, Neutral Spine   Assume a hands and knees  position on a firm surface. Keep your hands under your shoulders and your knees under your hips. You may place padding under your knees for comfort.  Drop your head and point your tail bone toward the ground below you. This will round out your lower back like an angry cat.    Slowly lift your head and release your tail bone so that your back sags into a large arch, like an old horse.  Repeat this until you feel limber in your lower back.  Now, find your "sweet spot." This will be the most comfortable position somewhere between the two previous positions. This is your neutral spine. Once you have found this position, tense your stomach muscles to support your lower back.  STRENGTHENING EXERCISES - Low Back Strain These exercises may help you when beginning to rehabilitate your injury. These exercises should be done near your "sweet spot." This is the neutral, low-back arch, somewhere between fully rounded and fully arched, that is your least painful position. When performed in this safe range of motion, these exercises can be used for people who have either a flexion or extension based injury. These exercises may resolve your symptoms with or without further involvement from your physician, physical therapist or athletic trainer. While completing these exercises, remember:   Muscles can gain both the endurance and the strength needed for everyday activities through controlled exercises.  Complete these exercises as instructed by your physician, physical therapist or athletic trainer. Increase the resistance and repetitions only as guided.  You may experience muscle soreness or fatigue, but the pain or discomfort you are trying to eliminate should never worsen during these exercises. If this pain does worsen, stop and make certain you are following the directions exactly. If the pain is still present after adjustments, discontinue the exercise until you can discuss the trouble with your  caregiver.  STRENGTHENING - Deep Abdominals, Pelvic Tilt  Lie on a firm bed or floor. Keeping your legs in front of you, bend your knees so they are both pointed toward the ceiling and your feet are flat on the floor.  Tense your lower abdominal muscles to press your lower back into the floor. This motion will rotate your pelvis so that your tail bone is scooping upwards rather than pointing at your feet or into the floor.  STRENGTHENING - Abdominals, Crunches   Lie on a firm bed or floor. Keeping your legs in front of you, bend your knees so they are both pointed toward the ceiling and your feet are flat on the floor. Cross your arms over your chest.  Slightly tip your chin down without bending your neck.  Tense your abdominals and slowly lift your trunk high enough to just clear your shoulder blades. Lifting  higher can put excessive stress on the lower back and does not further strengthen your abdominal muscles.  Control your return to the starting position.  STRENGTHENING - Quadruped, Opposite UE/LE Lift   Assume a hands and knees position on a firm surface. Keep your hands under your shoulders and your knees under your hips. You may place padding under your knees for comfort.  Find your neutral spine and gently tense your abdominal muscles so that you can maintain this position. Your shoulders and hips should form a rectangle that is parallel with the floor and is not twisted.  Keeping your trunk steady, lift your right hand no higher than your shoulder and then your left leg no higher than your hip. Make sure you are not holding your breath.   Continuing to keep your abdominal muscles tense and your back steady, slowly return to your starting position. Repeat with the opposite arm and leg.  STRENGTHENING - Lower Abdominals, Double Knee Lift  Lie on a firm bed or floor. Keeping your legs in front of you, bend your knees so they are both pointed toward the ceiling and your feet are  flat on the floor.  Tense your abdominal muscles to brace your lower back and slowly lift both of your knees until they come over your hips. Be certain not to hold your breath.  POSTURE AND BODY MECHANICS CONSIDERATIONS - Low Back Strain Keeping correct posture when sitting, standing or completing your activities will reduce the stress put on different body tissues, allowing injured tissues a chance to heal and limiting painful experiences. The following are general guidelines for improved posture. Your physician or physical therapist will provide you with any instructions specific to your needs. While reading these guidelines, remember:  The exercises prescribed by your provider will help you have the flexibility and strength to maintain correct postures.  The correct posture provides the best environment for your joints to work. All of your joints have less wear and tear when properly supported by a spine with good posture. This means you will experience a healthier, less painful body.  Correct posture must be practiced with all of your activities, especially prolonged sitting and standing. Correct posture is as important when doing repetitive low-stress activities (typing) as it is when doing a single heavy-load activity (lifting). RESTING POSITIONS Consider which positions are most painful for you when choosing a resting position. If you have pain with flexion-based activities (sitting, bending, stooping, squatting), choose a position that allows you to rest in a less flexed posture. You would want to avoid curling into a fetal position on your side. If your pain worsens with extension-based activities (prolonged standing, working overhead), avoid resting in an extended position such as sleeping on your stomach. Most people will find more comfort when they rest with their spine in a more neutral position, neither too rounded nor too arched. Lying on a non-sagging bed on your side with a pillow  between your knees, or on your back with a pillow under your knees will often provide some relief. Keep in mind, being in any one position for a prolonged period of time, no matter how correct your posture, can still lead to stiffness. PROPER SITTING POSTURE In order to minimize stress and discomfort on your spine, you must sit with correct posture. Sitting with good posture should be effortless for a healthy body. Returning to good posture is a gradual process. Many people can work toward this most comfortably by using various supports   until they have the flexibility and strength to maintain this posture on their own. When sitting with proper posture, your ears will fall over your shoulders and your shoulders will fall over your hips. You should use the back of the chair to support your upper back. Your lower back will be in a neutral position, just slightly arched. You may place a small pillow or folded towel at the base of your lower back for support.  When working at a desk, create an environment that supports good, upright posture. Without extra support, muscles tire, which leads to excessive strain on joints and other tissues. Keep these recommendations in mind: CHAIR:  A chair should be able to slide under your desk when your back makes contact with the back of the chair. This allows you to work closely.  The chair's height should allow your eyes to be level with the upper part of your monitor and your hands to be slightly lower than your elbows. BODY POSITION  Your feet should make contact with the floor. If this is not possible, use a foot rest.  Keep your ears over your shoulders. This will reduce stress on your neck and lower back. INCORRECT SITTING POSTURES  If you are feeling tired and unable to assume a healthy sitting posture, do not slouch or slump. This puts excessive strain on your back tissues, causing more damage and pain. Healthier options include:  Using more support, like a  lumbar pillow.  Switching tasks to something that requires you to be upright or walking.  Talking a brief walk.  Lying down to rest in a neutral-spine position. PROLONGED STANDING WHILE SLIGHTLY LEANING FORWARD  When completing a task that requires you to lean forward while standing in one place for a long time, place either foot up on a stationary 2-4 inch high object to help maintain the best posture. When both feet are on the ground, the lower back tends to lose its slight inward curve. If this curve flattens (or becomes too large), then the back and your other joints will experience too much stress, tire more quickly, and can cause pain. CORRECT STANDING POSTURES Proper standing posture should be assumed with all daily activities, even if they only take a few moments, like when brushing your teeth. As in sitting, your ears should fall over your shoulders and your shoulders should fall over your hips. You should keep a slight tension in your abdominal muscles to brace your spine. Your tailbone should point down to the ground, not behind your body, resulting in an over-extended swayback posture.  INCORRECT STANDING POSTURES  Common incorrect standing postures include a forward head, locked knees and/or an excessive swayback. WALKING Walk with an upright posture. Your ears, shoulders and hips should all line-up. PROLONGED ACTIVITY IN A FLEXED POSITION When completing a task that requires you to bend forward at your waist or lean over a low surface, try to find a way to stabilize 3 out of 4 of your limbs. You can place a hand or elbow on your thigh or rest a knee on the surface you are reaching across. This will provide you more stability so that your muscles do not fatigue as quickly. By keeping your knees relaxed, or slightly bent, you will also reduce stress across your lower back. CORRECT LIFTING TECHNIQUES DO :   Assume a wide stance. This will provide you more stability and the opportunity  to get as close as possible to the object which you are  lifting.  Tense your abdominals to brace your spine. Bend at the knees and hips. Keeping your back locked in a neutral-spine position, lift using your leg muscles. Lift with your legs, keeping your back straight.  Test the weight of unknown objects before attempting to lift them.  Try to keep your elbows locked down at your sides in order get the best strength from your shoulders when carrying an object.  Always ask for help when lifting heavy or awkward objects. INCORRECT LIFTING TECHNIQUES DO NOT:   Lock your knees when lifting, even if it is a small object.  Bend and twist. Pivot at your feet or move your feet when needing to change directions.  Assume that you can safely pick up even a paper clip without proper posture.        IF you received an x-ray today, you will receive an invoice from [facility]. Please contact [facility] at [phone] with questions or concerns regarding your invoice.   IF you received labwork today, you will receive an invoice from [facility]. Please contact [facility] at [phone] with questions or concerns regarding your invoice.   Our billing staff will not be able to assist you with questions regarding bills from these companies.  You will be contacted with the lab results as soon as they are available. The fastest way to get your results is to activate your My Chart account. Instructions are located on the last page of this paperwork. If you have not heard from us regarding the results in 2 weeks, please contact this office.     

## 2018-01-07 NOTE — Progress Notes (Signed)
Alan Sanchez  MRN: [id number] DOB: [birth year]  Subjective:  Alan Sanchez is a 47 y.o. male who presents for evaluation of low back pain. The patient has had recurrent self limited episodes of low back pain in the past. Symptoms have been present for 3 days and are gradually improving.  Onset was related to / precipitated by lifting heavier weights at work.  He does not stretch before he works out.  The pain is located in the left and right thoracic back and does not radiate. The pain is described as soreness and occurs all day. He rates his pain as a 3 on a scale of 0-10. Symptoms are exacerbated by deep breathing, extension, lifting and twisting. Symptoms are improved by nothing. He has also tried rest and ginger which provided some symptom relief. He denies weakness in the right leg, weakness in the left leg, tingling in the right leg, tingling in the left leg, burning pain in the right leg, burning pain in the left leg, urinary hesitancy, urinary incontinence, urinary retention, bowel incontinence, constipation, impotence and groin/perineal numbness associated with the back pain. The patient has no "red flag" history indicative of complicated back pain.  Review of Systems  Constitutional: Negative for chills, diaphoresis and fever.  Gastrointestinal: Negative for abdominal pain, nausea and vomiting.  Genitourinary: Negative for hematuria.    Patient Active Problem List   Diagnosis Date Noted  . Bilateral knee pain 04/23/2014  . Right elbow pain 04/23/2014  . Left shoulder pain 04/23/2014  . OBESITY, UNSPECIFIED 12/09/2009  . PALPITATIONS 12/09/2009  . ABNORMAL ELECTROCARDIOGRAM 12/09/2009    Current Outpatient Medications on File Prior to Visit  Medication Sig Dispense Refill  . cholecalciferol (VITAMIN D) 1000 units tablet Take 1,000 Units by mouth daily.    Marland Kitchen glucosamine-chondroitin 500-400 MG tablet Take 1 tablet by mouth 3 (three) times daily.    . Multiple Vitamin  (MULTIVITAMIN) capsule Take 1 capsule by mouth daily.    . Omega-3 Fatty Acids (FISH OIL) 1000 MG CAPS Take by mouth.    . cyclobenzaprine (FLEXERIL) 10 MG tablet Take 0.5-1 tablets (5-10 mg total) by mouth 3 (three) times daily as needed for muscle spasms. (Patient not taking: Reported on 01/07/2018) 30 tablet 0  . meloxicam (MOBIC) 15 MG tablet Take 1 tablet (15 mg total) by mouth daily. (Patient not taking: Reported on 01/07/2018) 30 tablet 0   No current facility-administered medications on file prior to visit.     Allergies  Allergen Reactions  . Mold Extract [Trichophyton]      Objective:  BP 120/82 (BP Location: Left Arm, Patient Position: Sitting, Cuff Size: Large)   Pulse 88   Temp 98.7 F (37.1 C) (Oral)   Ht 5' 9.5" (1.765 m)   Wt 256 lb (116.1 kg)   SpO2 98%   BMI 37.26 kg/m   Physical Exam  Constitutional: He is oriented to person, place, and time. He appears well-developed and well-nourished. No distress.  HENT:  Head: Normocephalic and atraumatic.  Eyes: Conjunctivae are normal.  Neck: Normal range of motion.  Pulmonary/Chest: Effort normal.  Abdominal: There is no tenderness. There is no CVA tenderness.  Musculoskeletal:       Thoracic back: He exhibits tenderness (with palpation of bilateral musculature and with trunk rotation to left and right). He exhibits normal range of motion, no bony tenderness, no swelling and no edema.       Lumbar back: Normal.  Bilateral thoracic back musculature is tight to palpation.  Neurological: He is alert and oriented to person, place, and time.  Reflex Scores:      Patellar reflexes are 2+ on the right side and 2+ on the left side.      Achilles reflexes are 2+ on the right side and 2+ on the left side. Muscular strength 5/5 of bilateral lower extremities. Sensation of bilateral lower extremities intact.   Skin: Skin is warm and dry.  Vitals reviewed.    Results for orders placed or performed in visit on 01/07/18 (from  the past 24 hour(s))  POCT urinalysis dipstick     Status: Normal   Collection Time: 01/07/18  4:49 PM  Result Value Ref Range   Color, UA yellow yellow   Clarity, UA clear clear   Glucose, UA negative negative mg/dL   Bilirubin, UA negative negative   Ketones, POC UA negative negative mg/dL   Spec Grav, UA [id number] - 1.025   Blood, UA negative negative   pH, UA 5.5 5.0 - 8.0   Protein Ur, POC negative negative mg/dL   Urobilinogen, UA 0.2 0.2 or 1.0 E.U./dL   Nitrite, UA Negative Negative   Leukocytes, UA Negative Negative     Assessment and Plan :  1. Acute bilateral thoracic back pain Hx and PE findings consistent with muscular pain. Neuro exam intact.  Suspect muscular strain or spasm from lifting heavier weights.  Recommended rest, ice/heat, NSAIDs, stretching and muscle relaxants prn.  Given educational material for muscle stretches.  Also recommended stretching in the future before working out.  Advised to return to clinic if symptoms worsen, do not improve, or as needed. - POCT urinalysis dipstick - naproxen (NAPROSYN) 500 MG tablet; Take 1 tablet (500 mg total) by mouth 2 (two) times daily with a meal.  Dispense: 30 tablet; Refill: 0 - cyclobenzaprine (FLEXERIL) 5 MG tablet; Take 1 tablet (5 mg total) by mouth 3 (three) times daily as needed for muscle spasms.  Dispense: 60 tablet; Refill: 0   Benjiman CoreBrittany Wiseman PA-C  [facility]  [facility] 01/07/2018 4:49 PM

## 2018-01-08 ENCOUNTER — Encounter: Payer: Self-pay | Admitting: Physician Assistant

## 2018-08-04 IMAGING — DX DG HIP (WITH OR WITHOUT PELVIS) 2-3V*R*
3 series · 3 of 3 positions shown · non-contrast
Comparison: Lumbar radiographs 04/17/2012.

CLINICAL DATA: 45-year-old male with right hip pain and posterior
buttock pain with no known injury. Initial encounter.

EXAM:
DG HIP (WITH OR WITHOUT PELVIS) 2-3V RIGHT

[pelvis ap]
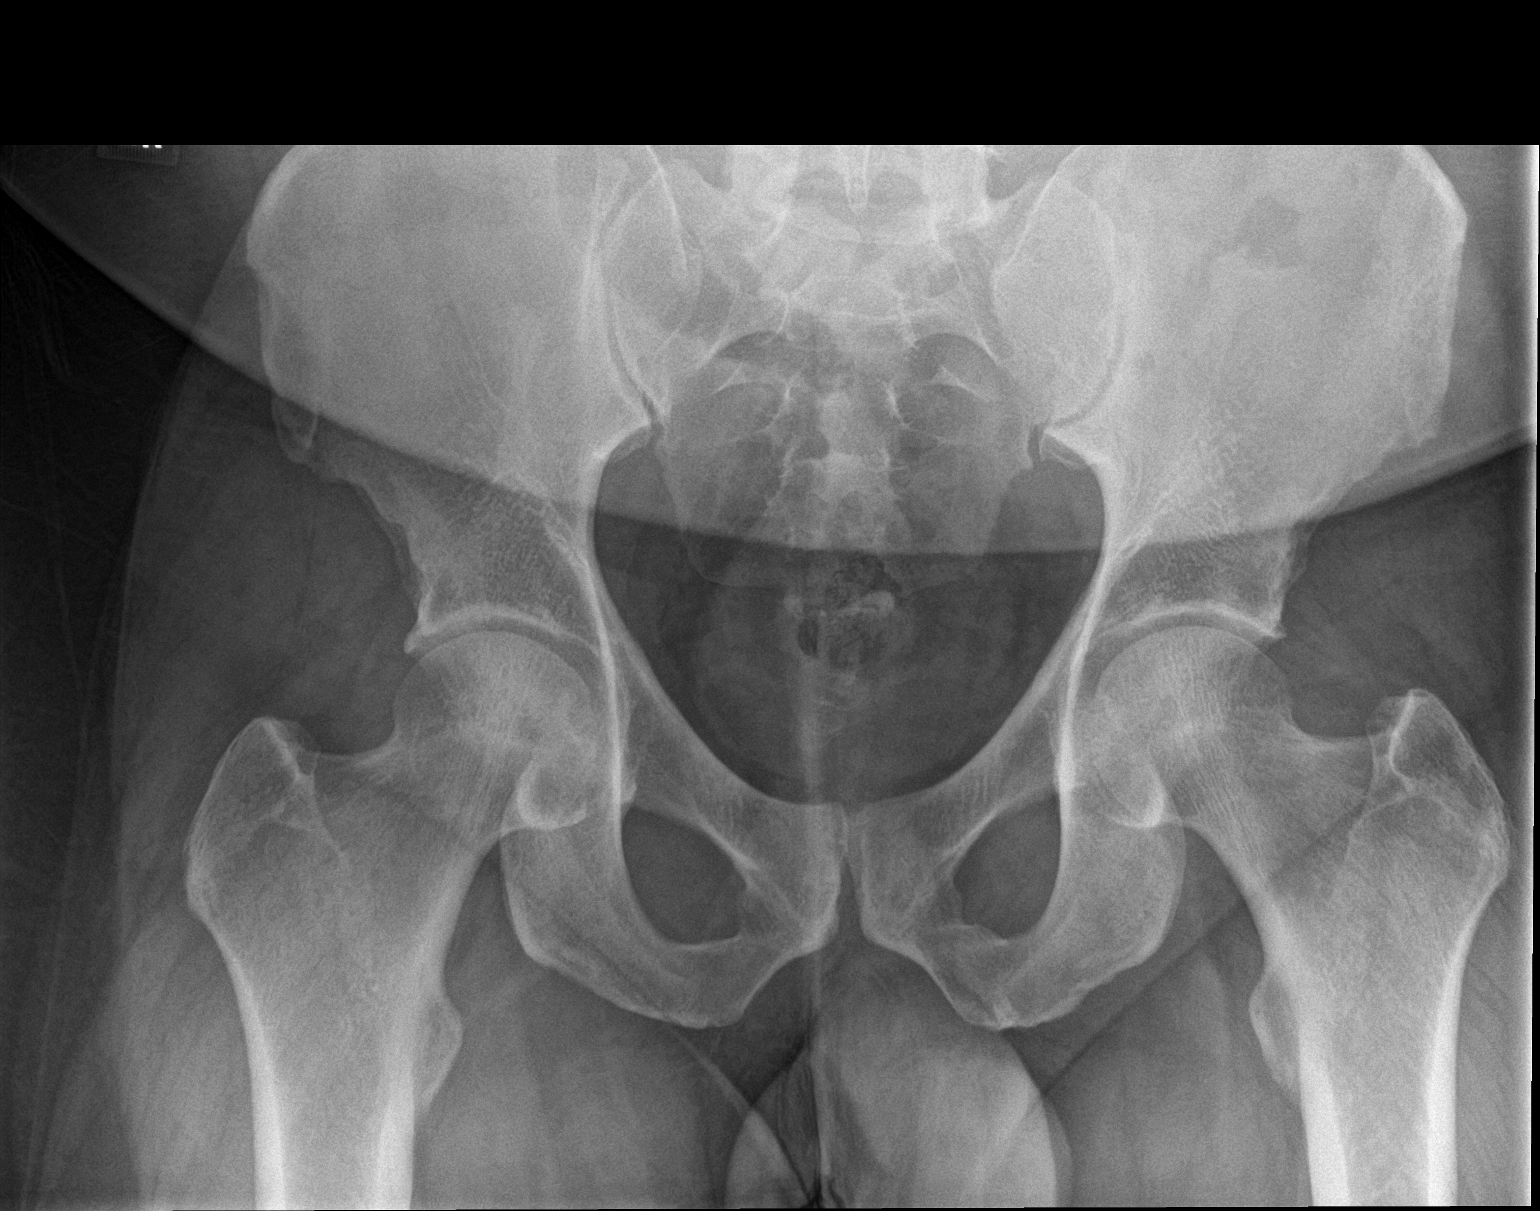

[hip ap]
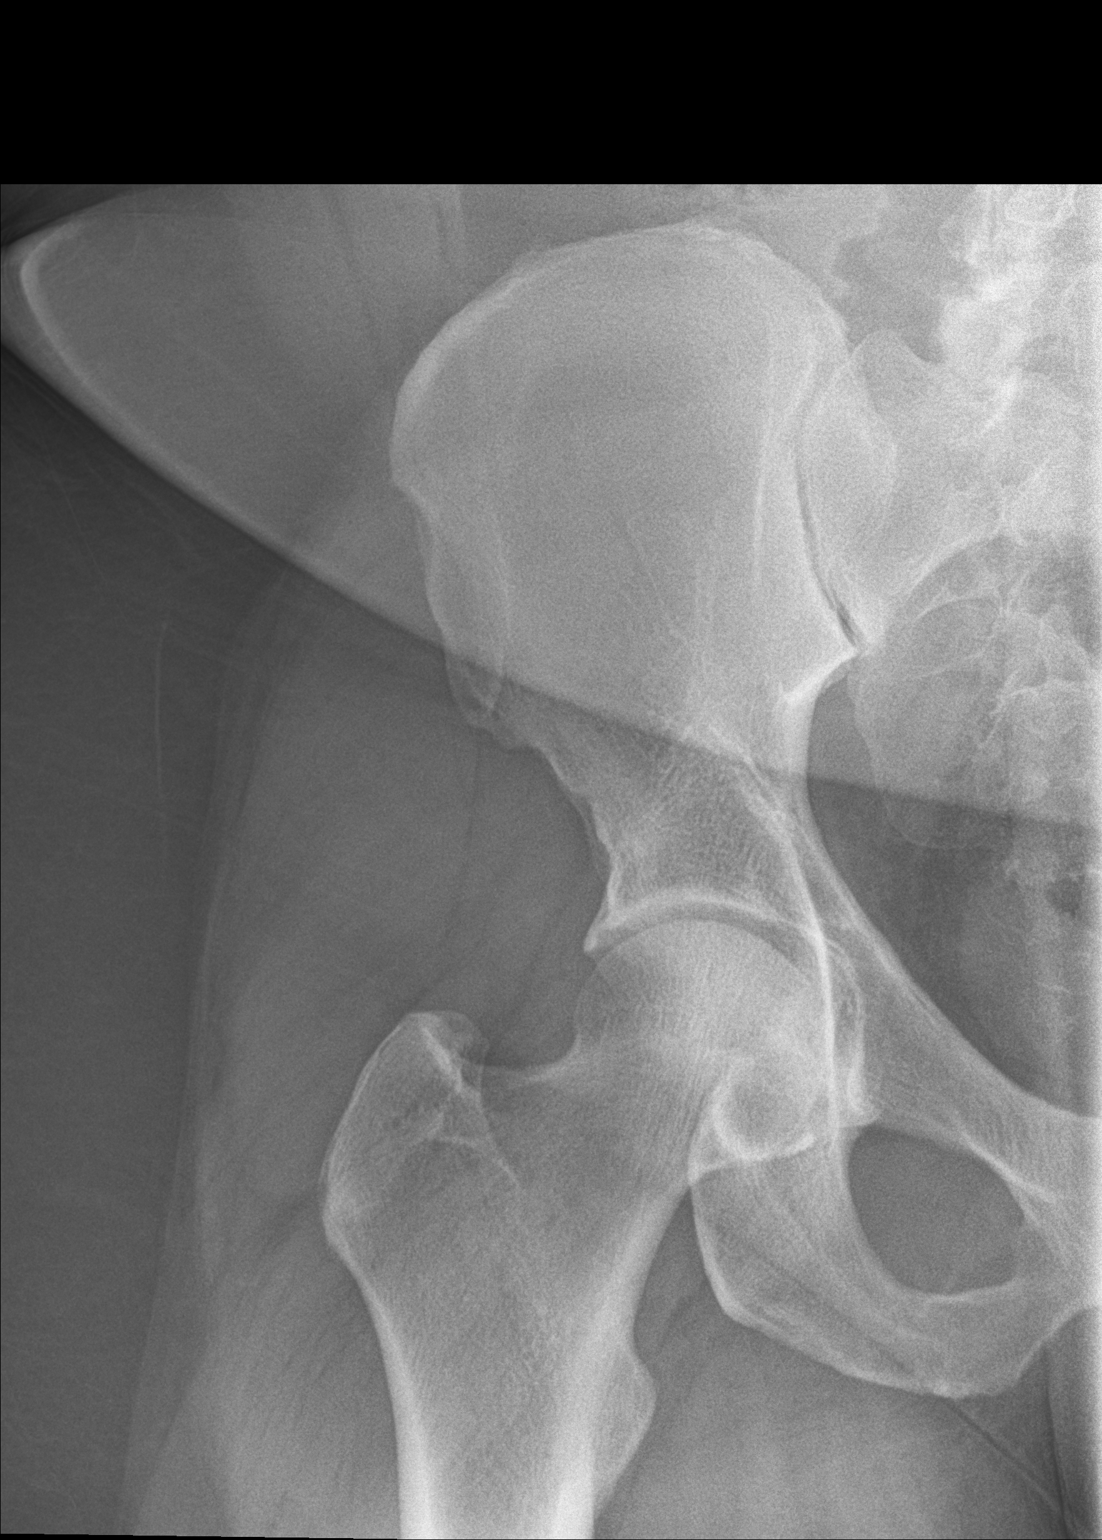

[hip lat]
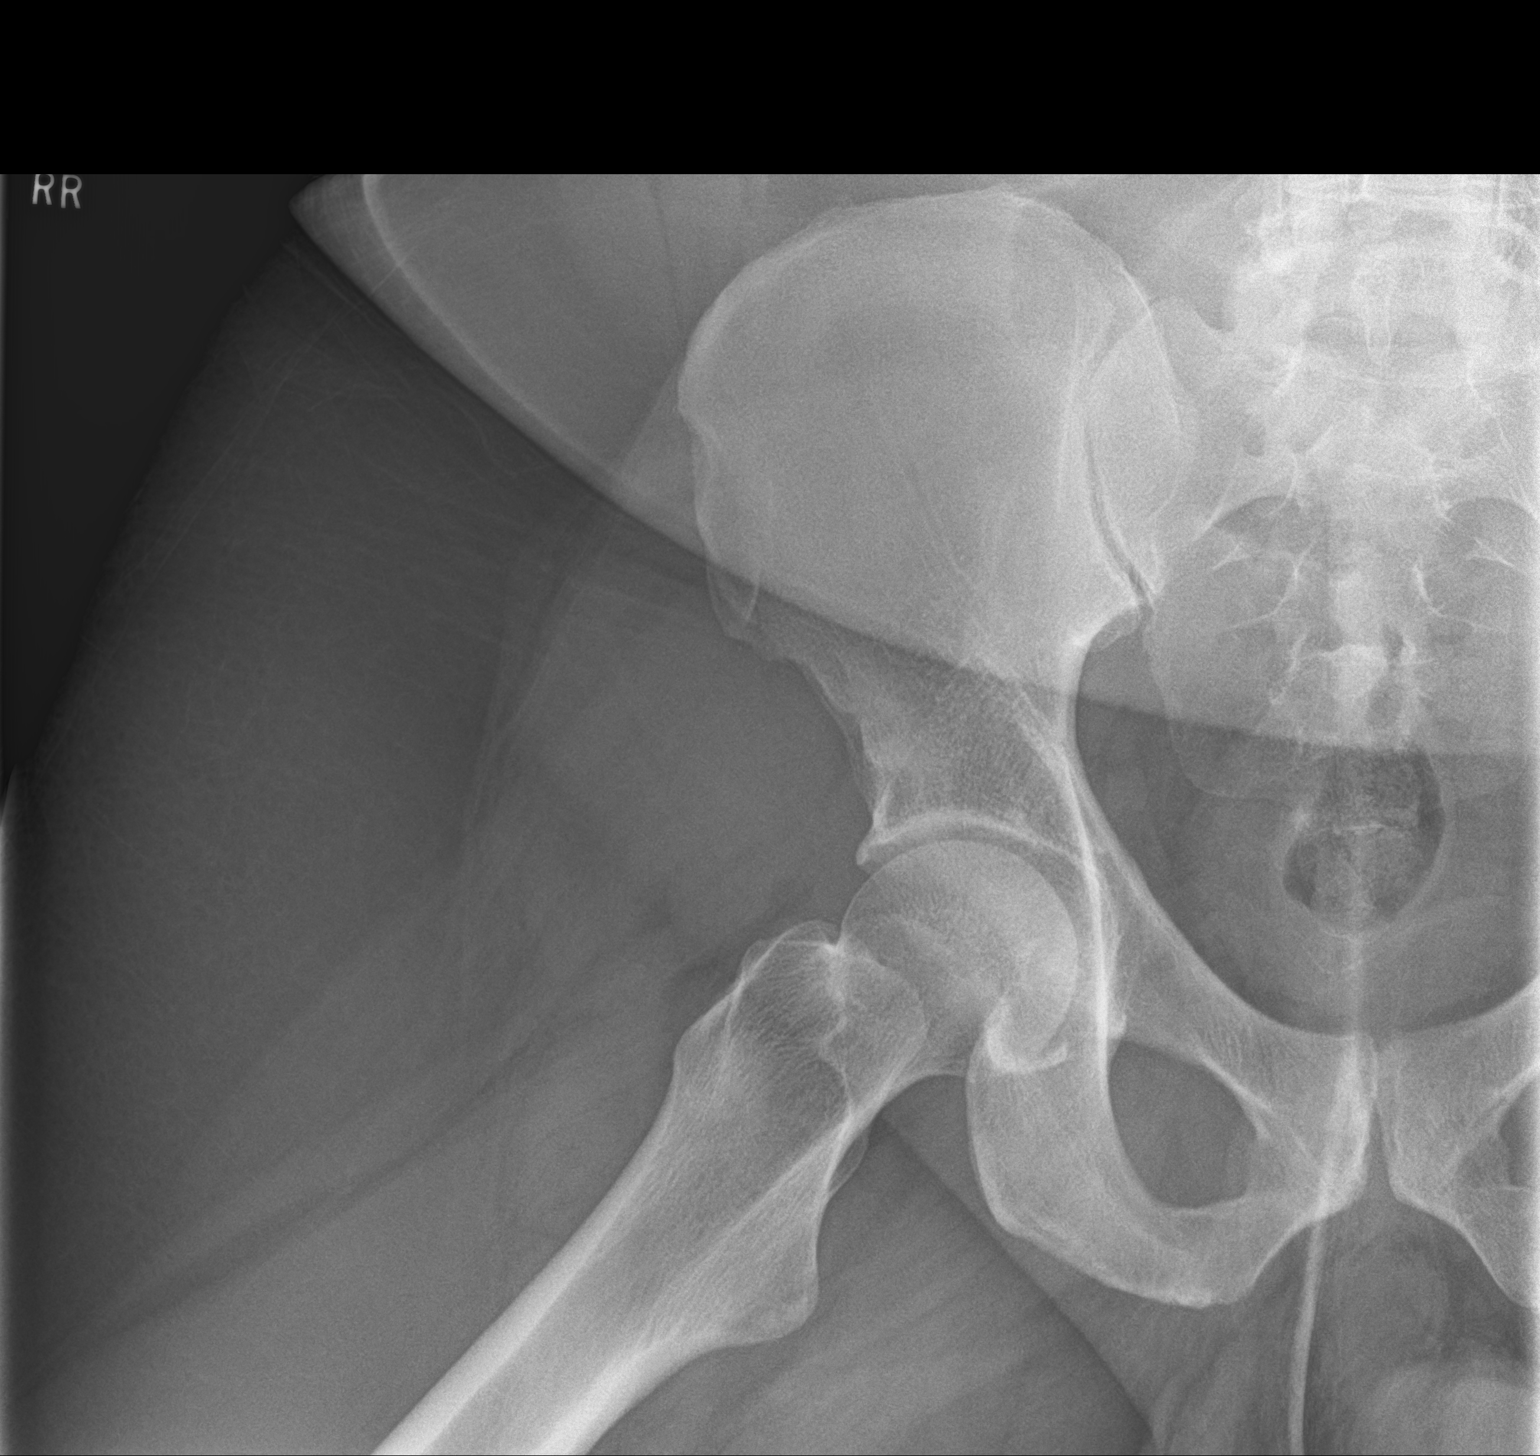

[3 of 3 positions shown; findings below may reference images not displayed]

FINDINGS: Both femoral heads are normally located. Normal bone mineralization.
Normal SI joints. Pelvis intact. Sacral ala appear intact. Grossly
intact proximal left femur. Intact proximal right femur. Overall hip
joint spaces appear stable since 3133 and within normal limits.
There is mild acetabular subchondral sclerosis which is greater on
the right.
IMPRESSION: No acute osseous abnormality identified about the right hip per
pelvis. Borderline to mild hip joint degeneration greater on the
right.

## 2019-12-10 ENCOUNTER — Ambulatory Visit: Payer: BC Managed Care – PPO | Attending: Family

## 2019-12-10 DIAGNOSIS — Z23 Encounter for immunization: Secondary | ICD-10-CM

## 2019-12-10 NOTE — Progress Notes (Signed)
   Covid-19 Vaccination Clinic  Name:  Alan Sanchez    MRN: [id number] DOB: 16-Feb-1971  12/10/2019  [name] was observed post Covid-19 immunization for 15 minutes without incident. He was provided with Vaccine Information Sheet and instruction to access the V-Safe system.   [name] was instructed to call 911 with any severe reactions post vaccine: Marland Kitchen Difficulty breathing  . Swelling of face and throat  . A fast heartbeat  . A bad rash all over body  . Dizziness and weakness   Immunizations Administered    Name Date Dose VIS Date Route   Moderna COVID-19 Vaccine 12/10/2019 12:31 PM 0.5 mL 09/01/2019 Intramuscular   Manufacturer: Moderna   Lot: [id number]   NDC: 39672-897-91

## 2020-01-12 ENCOUNTER — Ambulatory Visit: Payer: BC Managed Care – PPO | Attending: Family

## 2020-01-12 DIAGNOSIS — Z23 Encounter for immunization: Secondary | ICD-10-CM

## 2020-01-12 NOTE — Progress Notes (Signed)
   Covid-19 Vaccination Clinic  Name:  Alan Sanchez    MRN: [id number] DOB: 1971-03-27  01/12/2020  Alan Sanchez was observed post Covid-19 immunization for 15 minutes without incident. He was provided with Vaccine Information Sheet and instruction to access the V-Safe system.   Alan Sanchez was instructed to call 911 with any severe reactions post vaccine: Marland Kitchen Difficulty breathing  . Swelling of face and throat  . A fast heartbeat  . A bad rash all over body  . Dizziness and weakness   Immunizations Administered    Name Date Dose VIS Date Route   Moderna COVID-19 Vaccine 01/12/2020 10:59 AM 0.5 mL 09/01/2019 Intramuscular   Manufacturer: Moderna   Lot: [id number]   NDC: 20233-435-68

## 2020-04-19 ENCOUNTER — Ambulatory Visit: Payer: Self-pay

## 2020-05-16 ENCOUNTER — Other Ambulatory Visit: Payer: Self-pay

## 2020-05-16 ENCOUNTER — Ambulatory Visit (INDEPENDENT_AMBULATORY_CARE_PROVIDER_SITE_OTHER): Payer: BC Managed Care – PPO

## 2020-05-16 ENCOUNTER — Ambulatory Visit
Admission: RE | Admit: 2020-05-16 | Discharge: 2020-05-16 | Disposition: A | Discharge status: Home / Self Care | Payer: BC Managed Care – PPO | Source: Ambulatory Visit | Attending: Physician Assistant | Admitting: Physician Assistant

## 2020-05-16 VITALS — BP 139/97 | HR 80 | Temp 99.0°F | Resp 16

## 2020-05-16 DIAGNOSIS — M25442 Effusion, left hand: Secondary | ICD-10-CM

## 2020-05-16 DIAGNOSIS — M7989 Other specified soft tissue disorders: Secondary | ICD-10-CM

## 2020-05-16 DIAGNOSIS — M79642 Pain in left hand: Secondary | ICD-10-CM | POA: Diagnosis not present

## 2020-05-16 DIAGNOSIS — T63441A Toxic effect of venom of bees, accidental (unintentional), initial encounter: Secondary | ICD-10-CM

## 2020-05-16 MED ORDER — PREDNISONE 50 MG PO TABS: 50.0000 mg | ORAL_TABLET | Freq: Every day | ORAL | 0 refills | Status: DC

## 2020-05-16 NOTE — ED Triage Notes (Signed)
Pt states was getting stung by bees on Saturday and hit himself in the lt hand multiple times to get the bees off. States all his bee stings are better except to lt hand.

## 2020-05-16 NOTE — Discharge Instructions (Signed)
X-ray negative for fracture or dislocation.  Start prednisone as directed.  Ice compress, elevation, Ace wrap to help with swelling.  You  can continue antihistamine to help with itching.  Follow-up with PCP for further evaluation if symptoms not improving.  If noticing redness, warmth, fever, follow-up for reevaluation.

## 2020-05-16 NOTE — ED Provider Notes (Signed)
[facility]    CSN: [id number] Arrival date & time: 05/16/20  1847      History   Chief Complaint Chief Complaint  Patient presents with  . Hand Injury    HPI Alan Sanchez is a 49 y.o. male.   49 year old male comes in for left hand pain x 4 days. States got stung by bees, but at the time, also hitting left hand to remove bee. Since then, felt stings have improved but still with swelling to left hand with some pain, and therefore cam ine for evaluation. Antihistamine without relief.      Past Medical History:  Diagnosis Date  . Achilles tendon rupture    right    Patient Active Problem List   Diagnosis Date Noted  . Bilateral knee pain 04/23/2014  . Right elbow pain 04/23/2014  . Left shoulder pain 04/23/2014  . OBESITY, UNSPECIFIED 12/09/2009  . PALPITATIONS 12/09/2009  . ABNORMAL ELECTROCARDIOGRAM 12/09/2009    Past Surgical History:  Procedure Laterality Date  . ACHILLES TENDON SURGERY Right 02/10/2016   Procedure: ACHILLES TENDON REPAIR;  Surgeon: Marcene Corning, MD;  Location: [facility];  Service: Orthopedics;  Laterality: Right;  . FINGER AMPUTATION     Tip of Right middle finger  . WISDOM TOOTH EXTRACTION         Home Medications    Prior to Admission medications   Medication Sig Start Date End Date Taking? Authorizing Provider  cholecalciferol (VITAMIN D) 1000 units tablet Take 1,000 Units by mouth daily.    [provider]  glucosamine-chondroitin 500-400 MG tablet Take 1 tablet by mouth 3 (three) times daily.    [provider]  Multiple Vitamin (MULTIVITAMIN) capsule Take 1 capsule by mouth daily.    [provider]  naproxen (NAPROSYN) 500 MG tablet Take 1 tablet (500 mg total) by mouth 2 (two) times daily with a meal. 01/07/18   Barnett Abu, [place] D, PA-C  Omega-3 Fatty Acids (FISH OIL) 1000 MG CAPS Take by mouth.    [provider]  predniSONE (DELTASONE) 50 MG tablet Take 1  tablet (50 mg total) by mouth daily with breakfast. 05/16/20   Belinda Fisher, PA-C    Family History Family History  Problem Relation Age of Onset  . Hypertension Father   . Hypertension Brother   . Diabetes Mother   . Arrhythmia Neg Hx   . Heart disease Neg Hx        No early  . Cardiomyopathy Neg Hx        Nor sudden cardiac death    Social History Social History   Tobacco Use  . Smoking status: Never Smoker  . Smokeless tobacco: Never Used  Substance Use Topics  . Alcohol use: Yes    Alcohol/week: 0.0 standard drinks    Comment: Rarely  . Drug use: Never     Allergies   Mold extract [trichophyton]   Review of Systems Review of Systems  Reason unable to perform ROS: See HPI as above.     Physical Exam Triage Vital Signs ED Triage Vitals  Enc Vitals Group     BP 05/16/20 1935 (!) 139/97     Pulse Rate 05/16/20 1935 80     Resp 05/16/20 1935 16     Temp 05/16/20 1935 99 F (37.2 C)     Temp Source 05/16/20 1935 Oral     SpO2 05/16/20 1935 94 %     Weight --  Height --      Head Circumference --      Peak Flow --      Pain Score 05/16/20 1946 2     Pain Loc --      Pain Edu? --      Excl. in GC? --    No data found.  Updated Vital Signs BP (!) 139/97 (BP Location: Left Arm)   Pulse 80   Temp 99 F (37.2 C) (Oral)   Resp 16   SpO2 94%   Physical Exam Constitutional:      General: He is not in acute distress.    Appearance: Normal appearance. He is well-developed. He is not toxic-appearing or diaphoretic.  HENT:     Head: Normocephalic and atraumatic.  Eyes:     Conjunctiva/sclera: Conjunctivae normal.     Pupils: Pupils are equal, round, and reactive to light.  Pulmonary:     Effort: Pulmonary effort is normal. No respiratory distress.  Musculoskeletal:     Cervical back: Normal range of motion and neck supple.     Comments: Diffuse left hand swelling with pitting edema. No erythema, warmth, induration. Tender to palpation diffusely  along 1st and 2nd MCP. Decreased flexion of fingers due to swelling. NVI  Skin:    General: Skin is warm and dry.  Neurological:     Mental Status: He is alert and oriented to person, place, and time.      UC Treatments / Results  Labs (all labs ordered are listed, but only abnormal results are displayed) Labs Reviewed - No data to display  EKG   Radiology DG Hand Complete Left  Result Date: 05/16/2020 CLINICAL DATA:  49 year old male with trauma to the left hand. EXAM: LEFT HAND - COMPLETE 3+ VIEW COMPARISON:  None. FINDINGS: No acute fracture or dislocation. The bones are osteopenic. There is diffuse soft tissue swelling of the dorsum of the hand. No radiopaque foreign object or soft tissue gas. IMPRESSION: 1. No acute fracture or dislocation. 2. Soft tissue swelling of the hand. Electronically Signed   By: Elgie Collard M.D.   On: 05/16/2020 19:54    Procedures Procedures (including critical care time)  Medications Ordered in UC Medications - No data to display  Initial Impression / Assessment and Plan / UC Course  I have reviewed the triage vital signs and the nursing notes.  Pertinent labs & imaging results that were available during my care of the patient were reviewed by me and considered in my medical decision making (see chart for details).    Xray negative for fracture or dislocation. Prednisone for swelling/inflammation. Ice compress, antihistamine, ace wrap. Return precautions given.  Final Clinical Impressions(s) / UC Diagnoses   Final diagnoses:  Swelling of left hand  Bee sting, accidental or unintentional, initial encounter    ED Prescriptions    Medication Sig Dispense Auth. Provider   predniSONE (DELTASONE) 50 MG tablet Take 1 tablet (50 mg total) by mouth daily with breakfast. 5 tablet Belinda Fisher, PA-C     PDMP not reviewed this encounter.   Belinda Fisher, PA-C 05/16/20 2229

## 2021-04-28 ENCOUNTER — Ambulatory Visit
Admission: EM | Admit: 2021-04-28 | Discharge: 2021-04-28 | Disposition: A | Discharge status: Home / Self Care | Payer: BC Managed Care – PPO | Attending: Physician Assistant | Admitting: Physician Assistant

## 2021-04-28 ENCOUNTER — Other Ambulatory Visit: Payer: Self-pay

## 2021-04-28 DIAGNOSIS — J069 Acute upper respiratory infection, unspecified: Secondary | ICD-10-CM

## 2021-04-28 DIAGNOSIS — R059 Cough, unspecified: Secondary | ICD-10-CM | POA: Diagnosis not present

## 2021-04-28 DIAGNOSIS — R52 Pain, unspecified: Secondary | ICD-10-CM

## 2021-04-28 MED ORDER — BENZONATATE 100 MG PO CAPS: 100.0000 mg | ORAL_CAPSULE | Freq: Three times a day (TID) | ORAL | 0 refills | Status: DC

## 2021-04-28 NOTE — Discharge Instructions (Addendum)
We will contact you with your flu and COVID results if they are positive within a few days.  Please monitor your MyChart for this result.  I suspect you have a virus.  Please continue over-the-counter medications such as Mucinex and Flonase for symptom relief.  Make sure you are resting and drinking plenty of fluid.  You can use Tessalon for cough.  If you have any worsening symptoms including high fever, shortness of breath, chest pain, nausea, vomiting you need to be seen immediately.

## 2021-04-28 NOTE — ED Triage Notes (Signed)
Two day h/o congestion, fatigue, cough and body aches. No meds taken. No n/v/d/r.

## 2021-04-28 NOTE — ED Provider Notes (Signed)
[facility]    CSN: [id number] Arrival date & time: 04/28/21  1157      History   Chief Complaint Chief Complaint  Patient presents with   Nasal Congestion   Cough   Generalized Body Aches    HPI Alan Sanchez is a 50 y.o. male.   Patient reports 2-day history of URI symptoms.  Reports nasal congestion, fatigue, cough, body aches.  Denies any known sick contacts.  Denies any additional symptoms including fever, chest pain, shortness of breath, nausea, vomiting, diarrhea.  He has been taking over-the-counter medications including multisymptom relief without improvement of symptoms.  He has been vaccinated for COVID-19 including 2 boosters.  He does have a history of bronchitis and wanted to make sure that he is not developing that condition as he intends to go out of town in the near future.  Denies any recent antibiotic use.  Denies history of asthma, COPD, allergies, smoking.  He is able to perform daily activities despite symptoms.   Past Medical History:  Diagnosis Date   Achilles tendon rupture    right    Patient Active Problem List   Diagnosis Date Noted   Bilateral knee pain 04/23/2014   Right elbow pain 04/23/2014   Left shoulder pain 04/23/2014   OBESITY, UNSPECIFIED 12/09/2009   PALPITATIONS 12/09/2009   ABNORMAL ELECTROCARDIOGRAM 12/09/2009    Past Surgical History:  Procedure Laterality Date   ACHILLES TENDON SURGERY Right 02/10/2016   Procedure: ACHILLES TENDON REPAIR;  Surgeon: Marcene Corning, MD;  Location: [facility];  Service: Orthopedics;  Laterality: Right;   FINGER AMPUTATION     Tip of Right middle finger   WISDOM TOOTH EXTRACTION         Home Medications    Prior to Admission medications   Medication Sig Start Date End Date Taking? Authorizing Provider  benzonatate (TESSALON) 100 MG capsule Take 1 capsule (100 mg total) by mouth every 8 (eight) hours. 04/28/21  Yes Janal Haak K, PA-C  cholecalciferol  (VITAMIN D) 1000 units tablet Take 1,000 Units by mouth daily.    [provider]  glucosamine-chondroitin 500-400 MG tablet Take 1 tablet by mouth 3 (three) times daily.    [provider]  Multiple Vitamin (MULTIVITAMIN) capsule Take 1 capsule by mouth daily.    [provider]  naproxen (NAPROSYN) 500 MG tablet Take 1 tablet (500 mg total) by mouth 2 (two) times daily with a meal. 01/07/18   Barnett Abu, [place] D, PA-C  Omega-3 Fatty Acids (FISH OIL) 1000 MG CAPS Take by mouth.    [provider]    Family History Family History  Problem Relation Age of Onset   Hypertension Father    Hypertension Brother    Diabetes Mother    Arrhythmia Neg Hx    Heart disease Neg Hx        No early   Cardiomyopathy Neg Hx        Nor sudden cardiac death    Social History Social History   Tobacco Use   Smoking status: Never   Smokeless tobacco: Never  Substance Use Topics   Alcohol use: Yes    Alcohol/week: 0.0 standard drinks    Comment: Rarely   Drug use: Never     Allergies   Mold extract [trichophyton]   Review of Systems Review of Systems  Constitutional:  Positive for activity change. Negative for appetite change, chills, fatigue and fever.  HENT:  Positive for congestion and sinus  pressure. Negative for sneezing and sore throat.   Respiratory:  Positive for cough. Negative for shortness of breath.   Cardiovascular:  Negative for chest pain.  Gastrointestinal:  Negative for abdominal pain, diarrhea, nausea and vomiting.  Musculoskeletal:  Positive for arthralgias and myalgias.  Neurological:  Negative for dizziness, light-headedness and headaches.    Physical Exam Triage Vital Signs ED Triage Vitals  Enc Vitals Group     BP 04/28/21 1246 (!) 150/99     Pulse Rate 04/28/21 1246 90     Resp 04/28/21 1246 18     Temp 04/28/21 1246 98.6 F (37 C)     Temp Source 04/28/21 1246 Oral     SpO2 04/28/21 1246 95 %     Weight --      Height --       Head Circumference --      Peak Flow --      Pain Score 04/28/21 1248 2     Pain Loc --      Pain Edu? --      Excl. in GC? --    No data found.  Updated Vital Signs BP (!) 150/99 (BP Location: Left Arm)   Pulse 90   Temp 98.6 F (37 C) (Oral)   Resp 18   SpO2 95%   Visual Acuity Right Eye Distance:   Left Eye Distance:   Bilateral Distance:    Right Eye Near:   Left Eye Near:    Bilateral Near:     Physical Exam Vitals reviewed.  Constitutional:      General: He is awake.     Appearance: Normal appearance. He is normal weight. He is not ill-appearing.     Comments: Very pleasant male appears stated age no acute distress  HENT:     Head: Normocephalic and atraumatic.     Right Ear: Tympanic membrane, ear canal and external ear normal. Tympanic membrane is not erythematous or bulging.     Left Ear: Tympanic membrane, ear canal and external ear normal. Tympanic membrane is not erythematous or bulging.     Nose: Nose normal.     Mouth/Throat:     Pharynx: Uvula midline. Posterior oropharyngeal erythema present. No oropharyngeal exudate.     Comments: Moderate erythema posterior oropharynx Cardiovascular:     Rate and Rhythm: Normal rate and regular rhythm.     Heart sounds: Normal heart sounds, S1 normal and S2 normal. No murmur heard. Pulmonary:     Effort: Pulmonary effort is normal. No accessory muscle usage or respiratory distress.     Breath sounds: Normal breath sounds. No stridor. No wheezing, rhonchi or rales.     Comments: Clear to auscultation bilaterally Abdominal:     General: Bowel sounds are normal.     Palpations: Abdomen is soft.     Tenderness: There is no abdominal tenderness.  Lymphadenopathy:     Head:     Right side of head: No submental, submandibular or tonsillar adenopathy.     Left side of head: No submental, submandibular or tonsillar adenopathy.     Cervical: No cervical adenopathy.  Neurological:     Mental Status: He is alert.   Psychiatric:        Behavior: Behavior is cooperative.     UC Treatments / Results  Labs (all labs ordered are listed, but only abnormal results are displayed) Labs Reviewed  COVID-19, FLU A+B NAA    EKG   Radiology No results found.  Procedures Procedures (including critical care time)  Medications Ordered in UC Medications - No data to display  Initial Impression / Assessment and Plan / UC Course  I have reviewed the triage vital signs and the nursing notes.  Pertinent labs & imaging results that were available during my care of the patient were reviewed by me and considered in my medical decision making (see chart for details).      Patient tested for flu and COVID given short duration of symptoms.  Discussed likely viral etiology given short duration of symptoms.  No evidence of acute infection that warrant initiation of antibiotics.  Patient was prescribed Tessalon to be used as needed for cough.  Recommended he use over-the-counter medications for symptom management.  Discussed alarm symptoms that warrant emergent evaluation.  Strict return precautions given to which patient expressed understanding.  Final Clinical Impressions(s) / UC Diagnoses   Final diagnoses:  Cough  URI with cough and congestion  Body aches     Discharge Instructions      We will contact you with your flu and COVID results if they are positive within a few days.  Please monitor your MyChart for this result.  I suspect you have a virus.  Please continue over-the-counter medications such as Mucinex and Flonase for symptom relief.  Make sure you are resting and drinking plenty of fluid.  You can use Tessalon for cough.  If you have any worsening symptoms including high fever, shortness of breath, chest pain, nausea, vomiting you need to be seen immediately.     ED Prescriptions     Medication Sig Dispense Auth. Provider   benzonatate (TESSALON) 100 MG capsule Take 1 capsule (100 mg  total) by mouth every 8 (eight) hours. 21 capsule Ester Hilley K, PA-C      PDMP not reviewed this encounter.   Jeani Hawking, PA-C 04/28/21 1310

## 2021-04-29 LAB — COVID-19, FLU A+B NAA
Influenza A, NAA: NOT DETECTED
Influenza B, NAA: NOT DETECTED
SARS-CoV-2, NAA: DETECTED — AB

## 2021-05-01 ENCOUNTER — Telehealth: Payer: BC Managed Care – PPO | Admitting: Nurse Practitioner

## 2021-05-01 ENCOUNTER — Encounter: Payer: Self-pay | Admitting: Nurse Practitioner

## 2021-05-01 DIAGNOSIS — U071 COVID-19: Secondary | ICD-10-CM

## 2021-05-01 NOTE — Progress Notes (Signed)
Virtual Visit Consent   Alan Sanchez, you are scheduled for a virtual visit with a [facility] provider today.     Just as with appointments in the office, your consent must be obtained to participate.  Your consent will be active for this visit and any virtual visit you may have with one of our providers in the next 365 days.     If you have a MyChart account, a copy of this consent can be sent to you electronically.  All virtual visits are billed to your insurance company just like a traditional visit in the office.    As this is a virtual visit, video technology does not allow for your provider to perform a traditional examination.  This may limit your provider's ability to fully assess your condition.  If your provider identifies any concerns that need to be evaluated in person or the need to arrange testing (such as labs, EKG, etc.), we will make arrangements to do so.     Although advances in technology are sophisticated, we cannot ensure that it will always work on either your end or our end.  If the connection with a video visit is poor, the visit may have to be switched to a telephone visit.  With either a video or telephone visit, we are not always able to ensure that we have a secure connection.     I need to obtain your verbal consent now.   Are you willing to proceed with your visit today?    Elman Dettman has provided verbal consent on 05/01/2021 for a virtual visit (video or telephone).   Viviano Simas, FNP   Date: 05/01/2021 10:38 AM   Virtual Visit via Video Note   I, Viviano Simas, connected with  Alan Sanchez  ([id number], 1971/02/11) on 05/01/21 at 10:45 AM EDT by a video-enabled telemedicine application and verified that I am speaking with the correct person using two identifiers.  Location: Patient: Virtual Visit Location Patient: Home Provider: Virtual Visit Location Provider: Office/Clinic   I discussed the limitations of evaluation and management by  telemedicine and the availability of in person appointments. The patient expressed understanding and agreed to proceed.    History of Present Illness: Alan Sanchez is a 50 y.o. who identifies as a male who was assigned male at birth, and is being seen today for COVID-19 treatment, he was seen 04/28/21 at [facility] and tested results returned today positive for COVID-19. Symptom onset was 6 days ago.   He had symptom onset with some nausea and fatigue on day 1. He has had cough, congestion since that time. He was scheduled to go on a cruise so he had a rapid test as required two days ago while awaiting PCR from UC.  He was given Benzonatate for cough support at the [facility] and has been using that with good relief.   He was feeling better over the weekend and today he is feeling more fatigued. His cough has been mostly dry though at times he has coughed up small amounts of mucous denies SOB or wheezing.   He denies a history of asthma, has not needed inhalers in the past.  He has had bronchitis in the past most recently 2016.  He has not had COVID infection in the past.  He has been vaccinated x4 for COVID-19   Aside from ongoing fatigue he feels his overall symptoms are stable. Denies fever. Denies SOB    Problems:  Patient Active Problem List  Diagnosis Date Noted   Bilateral knee pain 04/23/2014   Right elbow pain 04/23/2014   Left shoulder pain 04/23/2014   OBESITY, UNSPECIFIED 12/09/2009   PALPITATIONS 12/09/2009   ABNORMAL ELECTROCARDIOGRAM 12/09/2009    Allergies:  Allergies  Allergen Reactions   Mold Extract [Trichophyton]    Medications:  Current Outpatient Medications:    benzonatate (TESSALON) 100 MG capsule, Take 1 capsule (100 mg total) by mouth every 8 (eight) hours., Disp: 21 capsule, Rfl: 0   cholecalciferol (VITAMIN D) 1000 units tablet, Take 1,000 Units by mouth daily., Disp: , Rfl:    glucosamine-chondroitin 500-400 MG tablet, Take 1 tablet by mouth 3 (three) times  daily., Disp: , Rfl:    Multiple Vitamin (MULTIVITAMIN) capsule, Take 1 capsule by mouth daily., Disp: , Rfl:    naproxen (NAPROSYN) 500 MG tablet, Take 1 tablet (500 mg total) by mouth 2 (two) times daily with a meal., Disp: 30 tablet, Rfl: 0   Omega-3 Fatty Acids (FISH OIL) 1000 MG CAPS, Take by mouth., Disp: , Rfl:   Observations/Objective: Patient is well-developed, well-nourished in no acute distress.  Resting comfortably at home.  Head is normocephalic, atraumatic.  No labored breathing.  Speech is clear and coherent with logical content.  Patient is alert and oriented at baseline.    Assessment and Plan: 1. COVID-19  Today is day 6 outside of treatment window for anti-viral medications. Patient is stable at this time. Will continue to manage symptoms with benzonatate, and vitamins.   He will f/u with PCP or UC with any new or worsening symptoms as discussed.      Follow Up Instructions: I discussed the assessment and treatment plan with the patient. The patient was provided an opportunity to ask questions and all were answered. The patient agreed with the plan and demonstrated an understanding of the instructions.  A copy of instructions were sent to the patient via MyChart.  The patient was advised to call back or seek an in-person evaluation if the symptoms worsen or if the condition fails to improve as anticipated.  Time:  I spent 20 minutes with the patient via telehealth technology discussing the above problems/concerns.    Viviano Simas, FNP

## 2021-05-08 ENCOUNTER — Telehealth: Payer: BC Managed Care – PPO | Admitting: Physician Assistant

## 2021-05-08 DIAGNOSIS — U071 COVID-19: Secondary | ICD-10-CM | POA: Diagnosis not present

## 2021-05-08 NOTE — Patient Instructions (Signed)
COVID-19: Quarantine and Isolation Quarantine If you were exposed Quarantine and stay away from others when you have been in close contact with someone whohas COVID-19. Isolate If you are sick or test positive Isolate when you are sick or when you have COVID-19, even if you don't have symptoms. When to stay home Calculating quarantine The date of your exposure is considered day 0. Day 1 is the first full day after your last contact with a person who has had COVID-19. Stay home and away from other people for at least 5 days. Learn why CDC updated guidance for the general public. IF YOU were exposed to COVID-19 and are NOT up-to-date IF YOU were exposed to COVID-19 and are NOT on COVID-19 vaccinations Quarantine for at least 5 days Stay home Stay home and quarantine for at least 5 full days. Wear a well-fitted mask if you must be around others in your home. Do not travel. Get tested Even if you don't develop symptoms, get tested at least 5 days after you last had close contact with someone with COVID-19. After quarantine Watch for symptoms Watch for symptoms until 10 days after you last had close contact with someone with COVID-19. Avoid travel It is best to avoid travel until a full 10 days after you last had close contact with someone with COVID-19. If you develop symptoms Isolate immediately and get tested. Continue to stay home until you know the results. Wear a well-fitted mask around others. Take precautions until day 10 Wear a mask Wear a well-fitted mask for 10 full days any time you are around others inside your home or in public. Do not go to places where you are unable to wear a mask. If you must travel during days 6-10, take precautions. Avoid being around people who are at high risk IF YOU were exposed to COVID-19 and are up-to-date IF YOU were exposed to COVID-19 and are on COVID-19 vaccinations No quarantine You do not need to stay home unless you develop  symptoms. Get tested Even if you don't develop symptoms, get tested at least 5 days after you last had close contact with someone with COVID-19. Watch for symptoms Watch for symptoms until 10 days after you last had close contact with someone with COVID-19. If you develop symptoms Isolate immediately and get tested. Continue to stay home until you know the results. Wear a well-fitted mask around others. Take precautions until day 10 Wear a mask Wear a well-fitted mask for 10 full days any time you are around others inside your home or in public. Do not go to places where you are unable to wear a mask. Take precautions if traveling Avoid being around people who are at high risk IF YOU were exposed to COVID-19 and had confirmed COVID-19 within the past 90 days (you tested positive using a viral test) No quarantine You do not need to stay home unless you develop symptoms. Watch for symptoms Watch for symptoms until 10 days after you last had close contact with someone with COVID-19. If you develop symptoms Isolate immediately and get tested. Continue to stay home until you know the results. Wear a well-fitted mask around others. Take precautions until day 10 Wear a mask Wear a well-fitted mask for 10 full days any time you are around others inside your home or in public. Do not go to places where you are unable to wear a mask. Take precautions if traveling Avoid being around people who are at high risk Calculating isolation   Day 0 is your first day of symptoms or a positive viral test. Day 1 is the first full day after your symptoms developed or your test specimen was collected. If you have COVID-19 or have symptoms, isolate for at least 5 days. IF YOU tested positive for COVID-19 or have symptoms, regardless of vaccination status Stay home for at least 5 days Stay home for 5 days and isolate from others in your home. Wear a well-fitted mask if you must be around others in your home. Do not  travel. Ending isolation if you had symptoms End isolation after 5 full days if you are fever-free for 24 hours (without the use of fever-reducing medication) and your symptoms are improving. Ending isolation if you did NOT have symptoms End isolation after at least 5 full days after your positive test. If you were severely ill with COVID-19 or are immunocompromised You should isolate for at least 10 days. Consult your doctor before ending isolation. Take precautions until day 10 Wear a mask Wear a well-fitted mask for 10 full days any time you are around others inside your home or in public. Do not go to places where you are unable to wear a mask. Do not travel Do not travel until a full 10 days after your symptoms started or the date your positive test was taken if you had no symptoms. Avoid being around people who are at high risk Definitions Exposure Contact with someone infected with SARS-CoV-2, the virus that causes COVID-19,in a way that increases the likelihood of getting infected with the virus. Close contact A close contact is someone who was less than 6 feet away from an infected person (laboratory-confirmed or a clinical diagnosis) for a cumulative total of 15 minutes or more over a 24-hour period. For example, three individual 5-minute exposures for a total of 15 minutes. People who are exposed to someone with COVID-19 after they completed at least 5 days of isolation are notconsidered close contacts. Quarantine Quarantine is a strategy used to prevent transmission of COVID-19 by keeping people who have been in close contact with someone with COVID-19 apart from others. Who does not need to quarantine? If you had close contact with someone with COVID-19 and you are in one of the following groups, you do not need to quarantine. You are up to date with your COVID-19 vaccines. You had confirmed COVID-19 within the last 90 days (meaning you tested positive using a viral test). You  should wear a well-fitting mask around others for 10 days from the date of your last close contact with someone with COVID-19 (the date of last close contact is considered day 0). Get tested at least 5 days after you last had close contact with someone with COVID-19. If you test positive or develop COVID-19 symptoms, isolate from other people and follow recommendations in the Isolation section below. If you tested positive for COVID-19 with a viral test within the previous 90 days and subsequently recovered and remain without COVID-19 symptoms, you do not need to quarantine or get tested after close contact. You should wear a well-fitting mask around others for 10 days from the date of your last close contact withsomeone with COVID-19 (the date of last close contact is considered day 0). Who should quarantine? If you come into close contact with someone with COVID-19, you should quarantine if you are not up to date on COVID-19 vaccines. This includes people who are not vaccinated. What to do for quarantine Stay home and away   from other people for at least 5 days (day 0 through day 5) after your last contact with a person who has COVID-19. The date of your exposure is considered day 0. Wear a well-fitting mask when around others at home, if possible. For 10 days after your last close contact with someone with COVID-19, watch for fever (100.4F or greater), cough, shortness of breath, or other COVID-19 symptoms. If you develop symptoms, get tested immediately and isolate until you receive your test results. If you test positive, follow isolation recommendations. If you do not develop symptoms, get tested at least 5 days after you last had close contact with someone with COVID-19. If you test negative, you can leave your home, but continue to wear a well-fitting mask when around others at home and in public until 10 days after your last close contact with someone with COVID-19. If you test positive, you should  isolate for at least 5 days from the date of your positive test (if you do not have symptoms). If you do develop COVID-19 symptoms, isolate for at least 5 days from the date your symptoms began (the date the symptoms started is day 0). Follow recommendations in the isolation section below. If you are unable to get a test 5 days after last close contact with someone with COVID-19, you can leave your home after day 5 if you have been without COVID-19 symptoms throughout the 5-day period. Wear a well-fitting mask for 10 days after your date of last close contact when around others at home and in public. Avoid people who are immunocompromised or at high risk for severe disease, and nursing homes and other high-risk settings, until after at least 10 days. If possible, stay away from people you live with, especially people who are at higher risk for getting very sick from COVID-19, as well as others outside your home throughout the full 10 days after your last close contact with someone with COVID-19. If you are unable to quarantine, you should wear a well-fitting mask for 10 days when around others at home and in public. If you are unable to wear a mask when around others, you should continue to quarantine for 10 days. Avoid people who are immunocompromised or at high risk for severe disease, and nursing homes and other high-risk settings, until after at least 10 days. See additional information about travel. Do not go to places where you are unable to wear a mask, such as restaurants and some gyms, and avoid eating around others at home and at work until after 10 days after your last close contact with someone with COVID-19. After quarantine Watch for symptoms until 10 days after your last close contact with someone with COVID-19. If you have symptoms, isolate immediately and get tested. Quarantine in high-risk congregate settings In certain congregate settings that have high risk of secondary transmission  (such as correctional and detention facilities, homeless shelters, or cruise ships), CDC recommends a 10-day quarantine for residents, regardless of vaccination and booster status. During periods of critical staffing shortages, facilities may consider shortening the quarantine period for staff to ensure continuity of operations. Decisions to shorten quarantine in these settings should be made in consultation with state, local, tribal, or territorial health departments and should take into consideration the context and characteristics of the facility. CDC's setting-specific guidance provides additional recommendations for these settings. Isolation Isolation is used to separate people with confirmed or suspected COVID-19 from those without COVID-19. People who are in isolation should stay   home until it's safe for them to be around others. At home, anyone sick or infected should separate from others, or wear a well-fitting mask when they need to be around others. People in isolation should stay in a specific "sick room" or area and use a separate bathroom if available. Everyone who has presumed or confirmed COVID-19 should stay home and isolate from other people for at least 5 full days (day 0 is the first day of symptoms or the date of the day of the positive viral test for asymptomatic persons). They should wear a mask when around others at home and in public for an additional 5 days. People who are confirmed to have COVID-19 or are showing symptoms of COVID-19 need to isolate regardless of their vaccination status. This includes: People who have a positive viral test for COVID-19, regardless of whether or not they have symptoms. People with symptoms of COVID-19, including people who are awaiting test results or have not been tested. People with symptoms should isolate even if they do not know if they have been in close contact with someone with COVID-19. What to do for isolation Monitor your symptoms. If you  have an emergency warning sign (including trouble breathing), seek emergency medical care immediately. Stay in a separate room from other household members, if possible. Use a separate bathroom, if possible. Take steps to improve ventilation at home, if possible. Avoid contact with other members of the household and pets. Don't share personal household items, like cups, towels, and utensils. Wear a well-fitting mask when you need to be around other people. Learn more about what to do if you are sick and how to notify your contacts. Ending isolation for people who had COVID-19 and had symptoms If you had COVID-19 and had symptoms, isolate for at least 5 days. To calculate your 5-day isolation period, day 0 is your first day of symptoms. Day 1 is the first full day after your symptoms developed. You can leave isolation after 5 full days. You can end isolation after 5 full days if you are fever-free for 24 hours without the use of fever-reducing medication and your other symptoms have improved (Loss of taste and smell may persist for weeks or months after recovery and need not delay the end of isolation). You should continue to wear a well-fitting mask around others at home and in public for 5 additional days (day 6 through day 10) after the end of your 5-day isolation period. If you are unable to wear a mask when around others, you should continue to isolate for a full 10 days. Avoid people who are immunocompromised or at high risk for severe disease, and nursing homes and other high-risk settings, until after at least 10 days. If you continue to have fever or your other symptoms have not improved after 5 days of isolation, you should wait to end your isolation until you are fever-free for 24 hours without the use of fever-reducing medication and your other symptoms have improved. Continue to wear a well-fitting mask. Contact your healthcare provider if you have questions. See additional information about  travel. Do not go to places where you are unable to wear a mask, such as restaurants and some gyms, and avoid eating around others at home and at work until a full 10 days after your first day of symptoms. If an individual has access to a test and wants to test, the best approach is to use an antigen test1 towards the end of   the 5-day isolation period. Collect the test sample only if you are fever-free for 24 hours without the use of fever-reducing medication and your other symptoms have improved (loss of taste and smell may persist for weeks or months after recovery and need not delay the end of isolation). If your test result is positive, you should continue to isolate until day 10. If your test result is negative, you can end isolation, but continue to wear a well-fitting mask around others at home and in public until day 10. Follow additional recommendations for masking and avoiding travel as described above. 1As noted in the labeling for authorized over-the counter antigen tests: Negative results should be treated as presumptive. Negative results do not rule out SARS-CoV-2 infection and should not be used as the sole basis for treatment or patient management decisions, including infection control decisions. To improve results, antigen tests should be used twice over a three-day period with at least 24 hours and no more than 48 hours between tests. Note that these recommendations on ending isolation do not apply to people with moderate or severe COVID-19 or with weakened immune systems (immunocompromised). See section below for recommendations for when toend isolation for these groups. Ending isolation for people who tested positive for COVID-19 but had no symptoms If you test positive for COVID-19 and never develop symptoms, isolate for at least 5 days. Day 0 is the day of your positive viral test (based on the date you were tested) and day 1 is the first full day after the specimen was collected for your  positive test. You can leave isolation after 5 full days. If you continue to have no symptoms, you can end isolation after at least 5 days. You should continue to wear a well-fitting mask around others at home and in public until day 10 (day 6 through day 10). If you are unable to wear a mask when around others, you should continue to isolate for 10 days. Avoid people who are immunocompromised or at high risk for severe disease, and nursing homes and other high-risk settings, until after at least 10 days. If you develop symptoms after testing positive, your 5-day isolation period should start over. Day 0 is your first day of symptoms. Follow the recommendations above for ending isolation for people who had COVID-19 and had symptoms. See additional information about travel. Do not go to places where you are unable to wear a mask, such as restaurants and some gyms, and avoid eating around others at home and at work until 10 days after the day of your positive test. If an individual has access to a test and wants to test, the best approach is to use an antigen test1 towards the end of the 5-day isolation period. If your test result is positive, you should continue to isolate until day 10. If your test result is negative, you can end isolation, but continue to wear a well-fitting mask around others at home and in public until day 10. Follow additionalrecommendations for masking and avoiding travel as described above. 1As noted in the labeling for authorized over-the counter antigen tests: Negative results should be treated as presumptive. Negative results do not rule out SARS-CoV-2 infection and should not be used as the sole basis for treatment or patient management decisions, including infection control decisions. To improve results, antigen tests should be used twice over a three-day period with at least 24 hours and no more than 48 hours between tests. Ending isolation for people who were   severely ill with  COVID-19 or have a weakened immune system (immunocompromised) People who are severely ill with COVID-19 (including those who were hospitalized or required intensive care or ventilation support) and people with compromised immune systems might need to isolate at home longer. They may also require testing with a viral test to determine when they can be around others. CDC recommends an isolation period of at least 10 and up to 20 days for people who were severely ill with COVID-19 and for people with weakened immune systems. Consult with your healthcare provider about when you can resume being aroundother people. People who are immunocompromised should talk to their healthcare provider about the potential for reduced immune responses to COVID-19 vaccines and the need to continue to follow current prevention measures (including wearing a well-fitting mask, staying 6 feet apart from others they don't live with, and avoiding crowds and poorly ventilated indoor spaces) to protect themselves against COVID-19 until advised otherwise by their healthcare provider. Close contacts of immunocompromised people--including household members--should also be encouraged to receive all recommended COVID-19 vaccine doses to help protect these people. Isolation in high-risk congregate settings In certain high-risk congregate settings that have high risk of secondary transmission and where it is not feasible to cohort people (such as correctional and detention facilities, homeless shelters, and cruise ships), CDC recommends a 10-day isolation period for residents. During periods of critical staffing shortages, facilities may consider shortening the isolation period for staff to ensure continuity of operations. Decisions to shorten isolation in these settings should be made in consultation with state, local, tribal, or territorial health departments and should take into consideration the context and characteristics of the facility.  CDC's setting-specific guidance provides additional recommendations for these settings. This CDC guidance is meant to supplement--not replace--any federal, state, local,territorial, or tribal health and safety laws, rules, and regulations. Recommendations for specific settings These recommendations do not apply to healthcare professionals. For guidance specific to these settings, see Healthcare professionals: Interim Guidance for Managing Healthcare Personnel with SARS-CoV-2 Infection or Exposure to SARS-CoV-2 Patients, residents, and visitors to healthcare settings: Interim Infection Prevention and Control Recommendations for Healthcare Personnel During the Coronavirus Disease 2019 (COVID-19) Pandemic Additional setting-specific guidance and recommendations are available. These recommendations on quarantine and isolation do apply to K-12 School settings. Additional guidance is available here: Overview of COVID-19 Quarantine for K-12 Schools Travelers: Travel information and recommendations Congregate facilities and other settings: guidance pages for community, work, and school settings Ongoing COVID-19 exposure FAQs I live with someone with COVID-19, but I cannot be separated from them. How do we manage quarantine in this situation? It is very important for people with COVID-19 to remain apart from other people, if possible, even if they are living together. If separation of the person with COVID-19 from others that they live with is not possible, the other people that they live with will have ongoing exposure, meaning they will be repeatedly exposed until that person is no longer able to spread the virus to other people. In this situation, there are precautions you can take to limit the spread of COVID-19: The person with COVID-19 and everyone they live with should wear a well-fitting mask inside the home. If possible, one person should care for the person with COVID-19 to limit the number of people  who are in close contact with the infected person. Take steps to protect yourself and others to reduce transmission in the home: Quarantine if you are not up to date with your COVID-19   vaccines. Isolate if you are sick or tested positive for COVID-19, even if you don't have symptoms. Learn more about the public health recommendations for testing, mask use and quarantine of close contacts, like yourself, who have ongoing exposure. These recommendations differ depending on your vaccination status. What should I do if I have ongoing exposure to COVID-19 from someone I live with? Recommendations for this situation depend on your vaccination status: If you are not up to date on COVID-19 vaccines and have ongoing exposure to COVID-19, you should: Begin quarantine immediately and continue to quarantine throughout the isolation period of the person with COVID-19. Continue to quarantine for an additional 5 days starting the day after the end of isolation for the person with COVID-19. Get tested at least 5 days after the end of isolation of the infected person that lives with them. If you test negative, you can leave the home but should continue to wear a well-fitting mask when around others at home and in public until 10 days after the end of isolation for the person with COVID-19. Isolate immediately if you develop symptoms of COVID-19 or test positive. If you are up to date with COVID-19 vaccines and have ongoing exposure to COVID-19, you should: Get tested at least 5 days after your first exposure. A person with COVID-19 is considered infectious starting 2 days before they develop symptoms, or 2 days before the date of their positive test if they do not have symptoms. Get tested again at least 5 days after the end of isolation for the person with COVID-19. Wear a well-fitting mask when you are around the person with COVID-19, and do this throughout their isolation period. Wear a well-fitting mask around  others for 10 days after the infected person's isolation period ends. Isolate immediately if you develop symptoms of COVID-19 or test positive. What should I do if multiple people I live with test positive for COVID-19 at different times? Recommendations for this situation depend on your vaccination status: If you are not up to date with your COVID-19 vaccines, you should: Quarantine throughout the isolation period of any infected person that you live with. Continue to quarantine until 5 days after the end of isolation date for the most recently infected person that lives with you. For example, if the last day of isolation of the person most recently infected with COVID-19 was June 30, the new 5-day quarantine period starts on July 1. Get tested at least 5 days after the end of isolation for the most recently infected person that lives with you. Wear a well-fitting mask when you are around any person with COVID-19 while that person is in isolation. Wear a well-fitting mask when you are around other people until 10 days after your last close contact. Isolate immediately if you develop symptoms of COVID-19 or test positive. If you are up to date with COVID-19 your vaccines , you should: Get tested at least 5 days after your first exposure. A person with COVID-19 is considered infectious starting 2 days before they developed symptoms, or 2 days before the date of their positive test if they do not have symptoms. Get tested again at least 5 days after the end of isolation for the most recently infected person that lives with you. Wear a well-fitting mask when you are around any person with COVID-19 while that person is in isolation. Wear a well-fitting mask around others for 10 days after the end of isolation for the most recently infected person that   lives with you. For example, if the last day of isolation for the person most recently infected with COVID-19 was June 30, the new 10-day period to wear a  well-fitting mask indoors in public starts on July 1. Isolate immediately if you develop symptoms of COVID-19 or test positive. I had COVID-19 and completed isolation. Do I have to quarantine or get tested if someone I live with gets COVID-19 shortly after I completed isolation? No. If you recently completed isolation and someone that lives with you tests positive for the virus that causes COVID-19 shortly after the end of your isolation period, you do not have to quarantine or get tested as long as you do not develop new symptoms. Once all of the people that live together have completed isolation or quarantine, refer to the guidance below for new exposures to COVID-19. If you had COVID-19 in the previous 90 days and then came into close contact with someone with COVID-19, you do not have to quarantine or get tested if you do not have symptoms. But you should: Wear a well-fitting mask indoors in public for 10 days after exposure. Monitor for COVID-19 symptoms and isolate immediately if symptoms develop. Consult with a healthcare provider for testing recommendations if new symptoms develop. If more than 90 days have passed since your recovery from infection, follow CDC's recommendations for close contacts. These recommendations will differ depending on your vaccination status. 10/27/2020 Content source: National Center for Immunization and Respiratory Diseases (NCIRD), Division of Viral Diseases This information is not intended to replace advice given to you by your health care provider. Make sure you discuss any questions you have with your healthcare provider. Document Revised: 11/04/2020 Document Reviewed: 11/04/2020 Elsevier Patient Education  2022 Elsevier Inc.  

## 2021-05-08 NOTE — Progress Notes (Signed)
Virtual Visit Consent   Alan Sanchez, you are scheduled for a virtual visit with a [facility] provider today.     Just as with appointments in the office, your consent must be obtained to participate.  Your consent will be active for this visit and any virtual visit you may have with one of our providers in the next 365 days.     If you have a MyChart account, a copy of this consent can be sent to you electronically.  All virtual visits are billed to your insurance company just like a traditional visit in the office.    As this is a virtual visit, video technology does not allow for your provider to perform a traditional examination.  This may limit your provider's ability to fully assess your condition.  If your provider identifies any concerns that need to be evaluated in person or the need to arrange testing (such as labs, EKG, etc.), we will make arrangements to do so.     Although advances in technology are sophisticated, we cannot ensure that it will always work on either your end or our end.  If the connection with a video visit is poor, the visit may have to be switched to a telephone visit.  With either a video or telephone visit, we are not always able to ensure that we have a secure connection.     I need to obtain your verbal consent now.   Are you willing to proceed with your visit today?    Alan Sanchez has provided verbal consent on 05/08/2021 for a virtual visit (video or telephone).   Alan Loveless, PA-C   Date: 05/08/2021 1:10 PM   Virtual Visit via Video Note   IMargaretann Sanchez, connected with  Alan Sanchez  ([id number], [birth year]) on 05/08/21 at  1:00 PM EDT by a video-enabled telemedicine application and verified that I am speaking with the correct person using two identifiers.  Location: Patient: Virtual Visit Location Patient: Home Provider: Virtual Visit Location Provider: Home Office   I discussed the limitations of evaluation and management  by telemedicine and the availability of in person appointments. The patient expressed understanding and agreed to proceed.    History of Present Illness: Alan Sanchez is a 50 y.o. who identifies as a male who was assigned male at birth, and is being seen today for continued symptoms from Covid 31. Tested positive on 04/28/21. Was seen at [facility] on 04/28/21, then seen by the digital health team on 05/01/21. At that time he had been having symptoms x 6 days and was advised to continue symptomatic management.  Today, he presents for questions about testing for covid after being recently infected. He reports that he retook a home rapid test and was negative, but also had a PCR test that came back positive. He just has questions about isolation and quarantine requirements.    Problems:  Patient Active Problem List   Diagnosis Date Noted   Bilateral knee pain 04/23/2014   Right elbow pain 04/23/2014   Left shoulder pain 04/23/2014   OBESITY, UNSPECIFIED 12/09/2009   PALPITATIONS 12/09/2009   ABNORMAL ELECTROCARDIOGRAM 12/09/2009    Allergies:  Allergies  Allergen Reactions   Mold Extract [Trichophyton]    Medications:  Current Outpatient Medications:    benzonatate (TESSALON) 100 MG capsule, Take 1 capsule (100 mg total) by mouth every 8 (eight) hours., Disp: 21 capsule, Rfl: 0   cholecalciferol (VITAMIN D) 1000 units tablet, Take 1,000 Units by  mouth daily., Disp: , Rfl:    glucosamine-chondroitin 500-400 MG tablet, Take 1 tablet by mouth 3 (three) times daily., Disp: , Rfl:    Multiple Vitamin (MULTIVITAMIN) capsule, Take 1 capsule by mouth daily., Disp: , Rfl:    naproxen (NAPROSYN) 500 MG tablet, Take 1 tablet (500 mg total) by mouth 2 (two) times daily with a meal., Disp: 30 tablet, Rfl: 0   Omega-3 Fatty Acids (FISH OIL) 1000 MG CAPS, Take by mouth., Disp: , Rfl:   Observations/Objective: Patient is well-developed, well-nourished in no acute distress.  Resting comfortably at home.   Head is normocephalic, atraumatic.  No labored breathing.  Speech is clear and coherent with logical content.  Patient is alert and oriented at baseline.    Assessment and Plan: 1. COVID-19  - He does report an improvement in symptoms, no fever - Discussed he could test positive on a PCR test for 90 days - If he is past 10 days from symptom onset, with improvement in symptoms and no fever for last 72 hours he can come out of isolation - Written information provided via AVS  Follow Up Instructions: I discussed the assessment and treatment plan with the patient. The patient was provided an opportunity to ask questions and all were answered. The patient agreed with the plan and demonstrated an understanding of the instructions.  A copy of instructions were sent to the patient via MyChart.  The patient was advised to call back or seek an in-person evaluation if the symptoms worsen or if the condition fails to improve as anticipated.  Time:  I spent 10 minutes with the patient via telehealth technology discussing the above problems/concerns.    Alan Loveless, PA-C

## 2021-07-27 ENCOUNTER — Encounter: Payer: Self-pay | Admitting: Physician Assistant

## 2021-07-29 ENCOUNTER — Encounter: Payer: Self-pay | Admitting: Nurse Practitioner

## 2022-06-01 ENCOUNTER — Ambulatory Visit
Admission: EM | Admit: 2022-06-01 | Discharge: 2022-06-01 | Disposition: A | Discharge status: Home / Self Care | Payer: BC Managed Care – PPO | Attending: Physician Assistant | Admitting: Physician Assistant

## 2022-06-01 DIAGNOSIS — H00014 Hordeolum externum left upper eyelid: Secondary | ICD-10-CM | POA: Diagnosis not present

## 2022-06-01 MED ORDER — POLYMYXIN B-TRIMETHOPRIM 10000-0.1 UNIT/ML-% OP SOLN: 1.0000 [drp] | OPHTHALMIC | 0 refills | Status: AC

## 2022-06-01 NOTE — ED Provider Notes (Signed)
Alan Sanchez    CSN: [id number] Arrival date & time: 06/01/22  5427      History   Chief Complaint Chief Complaint  Patient presents with   Eye Pain    HPI Alan Sanchez is a 51 y.o. male.   Patient here today for evaluation of left eyelid swelling he first noticed two days a go. He has not had any other symptoms including fever. He has not taken any medications for symptoms.   The history is provided by the patient.  Eye Pain Pertinent negatives include no shortness of breath.    Past Medical History:  Diagnosis Date   Achilles tendon rupture    right    Patient Active Problem List   Diagnosis Date Noted   Bilateral knee pain 04/23/2014   Right elbow pain 04/23/2014   Left shoulder pain 04/23/2014   OBESITY, UNSPECIFIED 12/09/2009   PALPITATIONS 12/09/2009   ABNORMAL ELECTROCARDIOGRAM 12/09/2009    Past Surgical History:  Procedure Laterality Date   ACHILLES TENDON SURGERY Right 02/10/2016   Procedure: ACHILLES TENDON REPAIR;  Surgeon: Marcene Corning, MD;  Location: [facility];  Service: Orthopedics;  Laterality: Right;   FINGER AMPUTATION     Tip of Right middle finger   WISDOM TOOTH EXTRACTION         Home Medications    Prior to Admission medications   Medication Sig Start Date End Date Taking? Authorizing Provider  trimethoprim-polymyxin b (POLYTRIM) ophthalmic solution Place 1 drop into the left eye every 4 (four) hours for 7 days. 06/01/22 06/08/22 Yes Tomi Bamberger, PA-C  benzonatate (TESSALON) 100 MG capsule Take 1 capsule (100 mg total) by mouth every 8 (eight) hours. 04/28/21   Raspet, Noberto Retort, PA-C  cholecalciferol (VITAMIN D) 1000 units tablet Take 1,000 Units by mouth daily.    [provider]  glucosamine-chondroitin 500-400 MG tablet Take 1 tablet by mouth 3 (three) times daily.    [provider]  Multiple Vitamin (MULTIVITAMIN) capsule Take 1 capsule by mouth daily.    [provider]   naproxen (NAPROSYN) 500 MG tablet Take 1 tablet (500 mg total) by mouth 2 (two) times daily with a meal. 01/07/18   Barnett Abu, [place] D, PA-C  Omega-3 Fatty Acids (FISH OIL) 1000 MG CAPS Take by mouth.    [provider]    Family History Family History  Problem Relation Age of Onset   Hypertension Father    Hypertension Brother    Diabetes Mother    Arrhythmia Neg Hx    Heart disease Neg Hx        No early   Cardiomyopathy Neg Hx        Nor sudden cardiac death    Social History Social History   Tobacco Use   Smoking status: Never   Smokeless tobacco: Never  Substance Use Topics   Alcohol use: Yes    Alcohol/week: 0.0 standard drinks of alcohol    Comment: Rarely   Drug use: Never     Allergies   Mold extract [trichophyton]   Review of Systems Review of Systems  Constitutional:  Negative for chills and fever.  HENT:  Negative for congestion and rhinorrhea.   Eyes:  Positive for redness (eyelid only). Negative for pain and discharge.  Respiratory:  Negative for cough and shortness of breath.   Gastrointestinal:  Negative for nausea and vomiting.  Neurological:  Negative for numbness.     Physical Exam Triage Vital Signs ED  Triage Vitals  Enc Vitals Group     BP 06/01/22 0839 (!) 162/91     Pulse Rate 06/01/22 0839 75     Resp 06/01/22 0839 14     Temp 06/01/22 0839 98 F (36.7 C)     Temp Source 06/01/22 0839 Oral     SpO2 06/01/22 0839 96 %     Weight --      Height --      Head Circumference --      Peak Flow --      Pain Score 06/01/22 0850 2     Pain Loc --      Pain Edu? --      Excl. in GC? --    No data found.  Updated Vital Signs BP (!) 162/91 (BP Location: Left Arm)   Pulse 75   Temp 98 F (36.7 C) (Oral)   Resp 14   SpO2 96%      Physical Exam Vitals and nursing note reviewed.  Constitutional:      General: He is not in acute distress.    Appearance: Normal appearance. He is not ill-appearing.  HENT:     Head:  Normocephalic and atraumatic.  Eyes:     Extraocular Movements: Extraocular movements intact.     Conjunctiva/sclera: Conjunctivae normal.     Pupils: Pupils are equal, round, and reactive to light.     Comments: Mild swelling, erythema to left upper eyelid  Cardiovascular:     Rate and Rhythm: Normal rate.  Pulmonary:     Effort: Pulmonary effort is normal.  Neurological:     Mental Status: He is alert.  Psychiatric:        Mood and Affect: Mood normal.        Behavior: Behavior normal.        Thought Content: Thought content normal.      UC Treatments / Results  Labs (all labs ordered are listed, but only abnormal results are displayed) Labs Reviewed - No data to display  EKG   Radiology No results found.  Procedures Procedures (including critical Sanchez time)  Medications Ordered in UC Medications - No data to display  Initial Impression / Assessment and Plan / UC Course  I have reviewed the triage vital signs and the nursing notes.  Pertinent labs & imaging results that were available during my Sanchez of the patient were reviewed by me and considered in my medical decision making (see chart for details).    Discussed that symptoms were consistent with stye and that alternating warm/ cold compresses would hopefully be helpful. Antibiotic drops prescribed given reported drainage/ concerns for developing conjunctivitis. Encouraged follow up if no gradual improvement or with any further concerns.   Final Clinical Impressions(s) / UC Diagnoses   Final diagnoses:  Hordeolum externum of left upper eyelid   Discharge Instructions   None    ED Prescriptions     Medication Sig Dispense Auth. Provider   trimethoprim-polymyxin b (POLYTRIM) ophthalmic solution Place 1 drop into the left eye every 4 (four) hours for 7 days. 10 mL Tomi Bamberger, PA-C      PDMP not reviewed this encounter.   Tomi Bamberger, PA-C 06/01/22 [phone]

## 2022-06-01 NOTE — ED Triage Notes (Signed)
Pt states left eyelid swelling and drainage for the past 2 days.

## 2022-06-22 IMAGING — DX DG HAND COMPLETE 3+V*L*
3 series · 3 of 3 positions shown · non-contrast
Comparison: None.

CLINICAL DATA: 49-year-old male with trauma to the left hand.

EXAM:
LEFT HAND - COMPLETE 3+ VIEW

[hand pa]
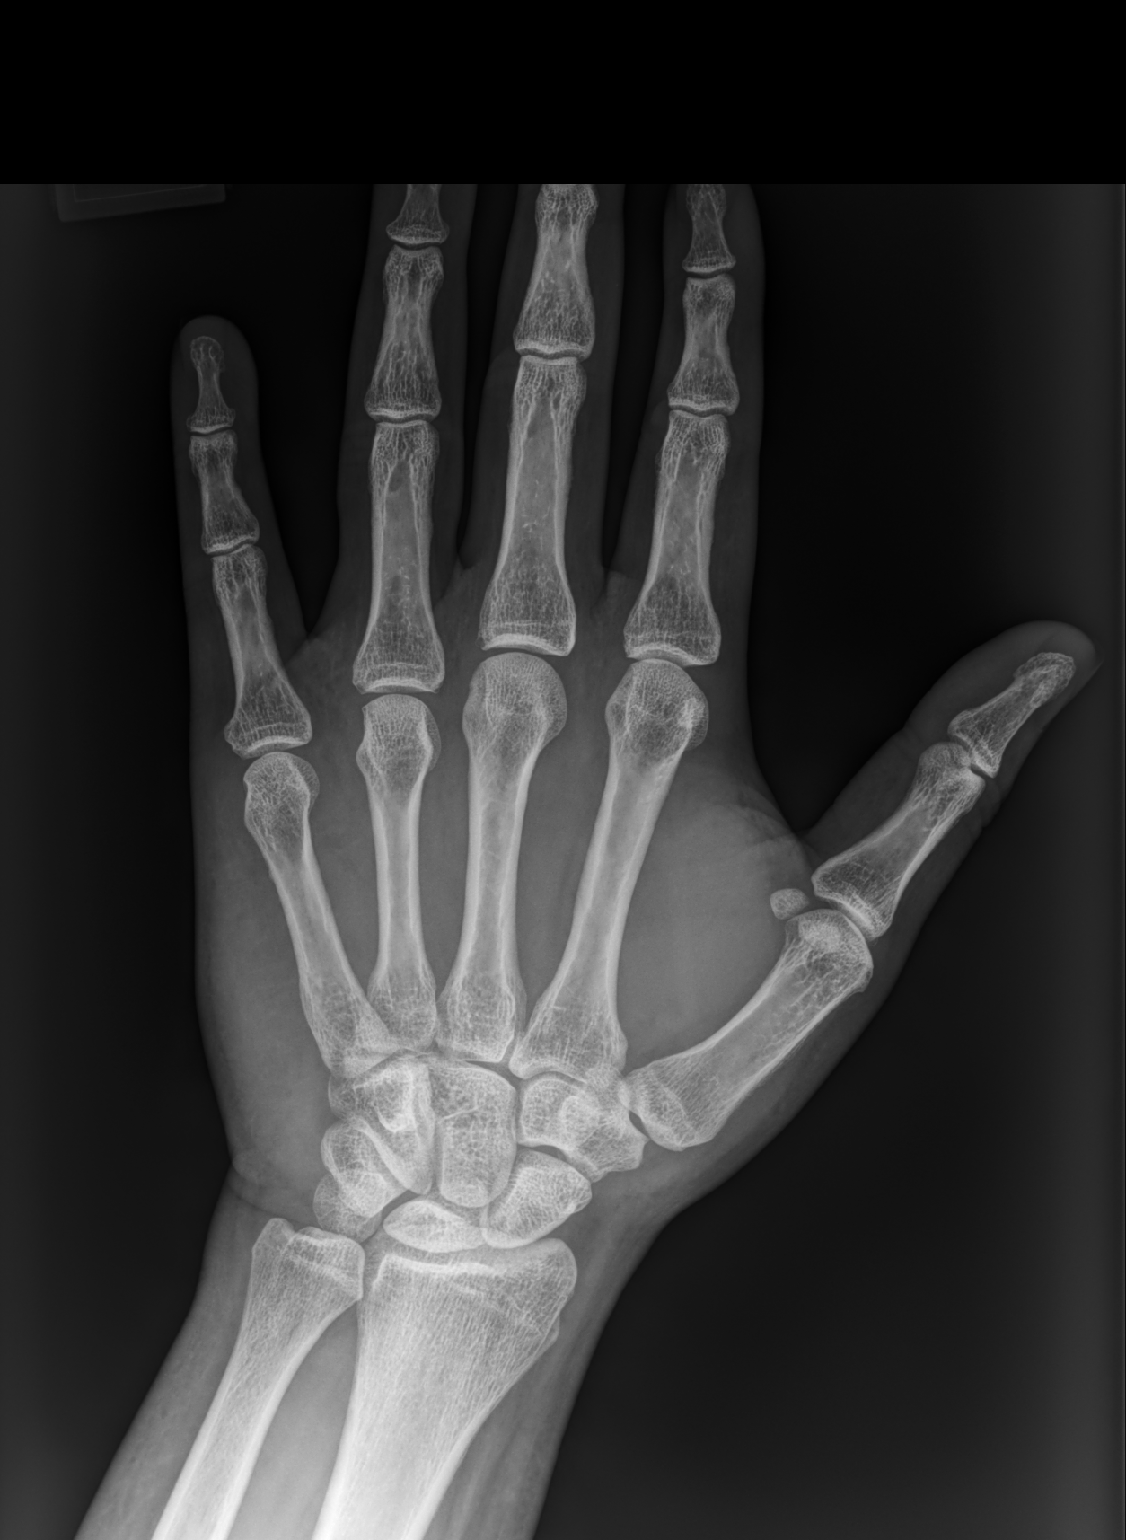

[hand mlo]
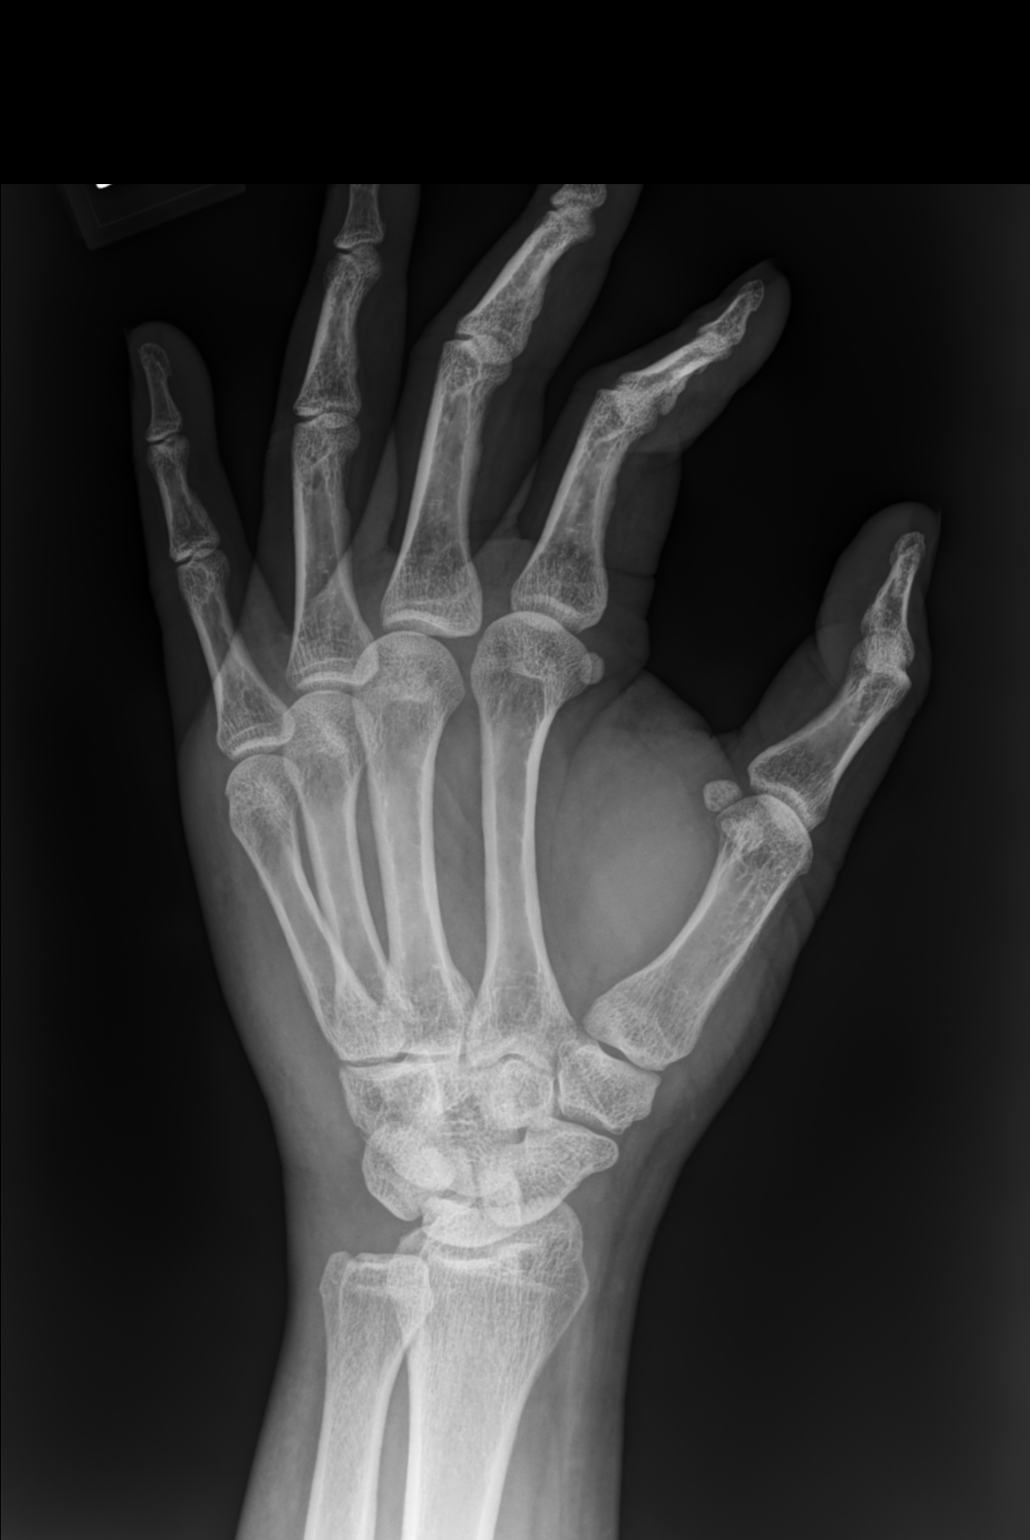

[hand lat]
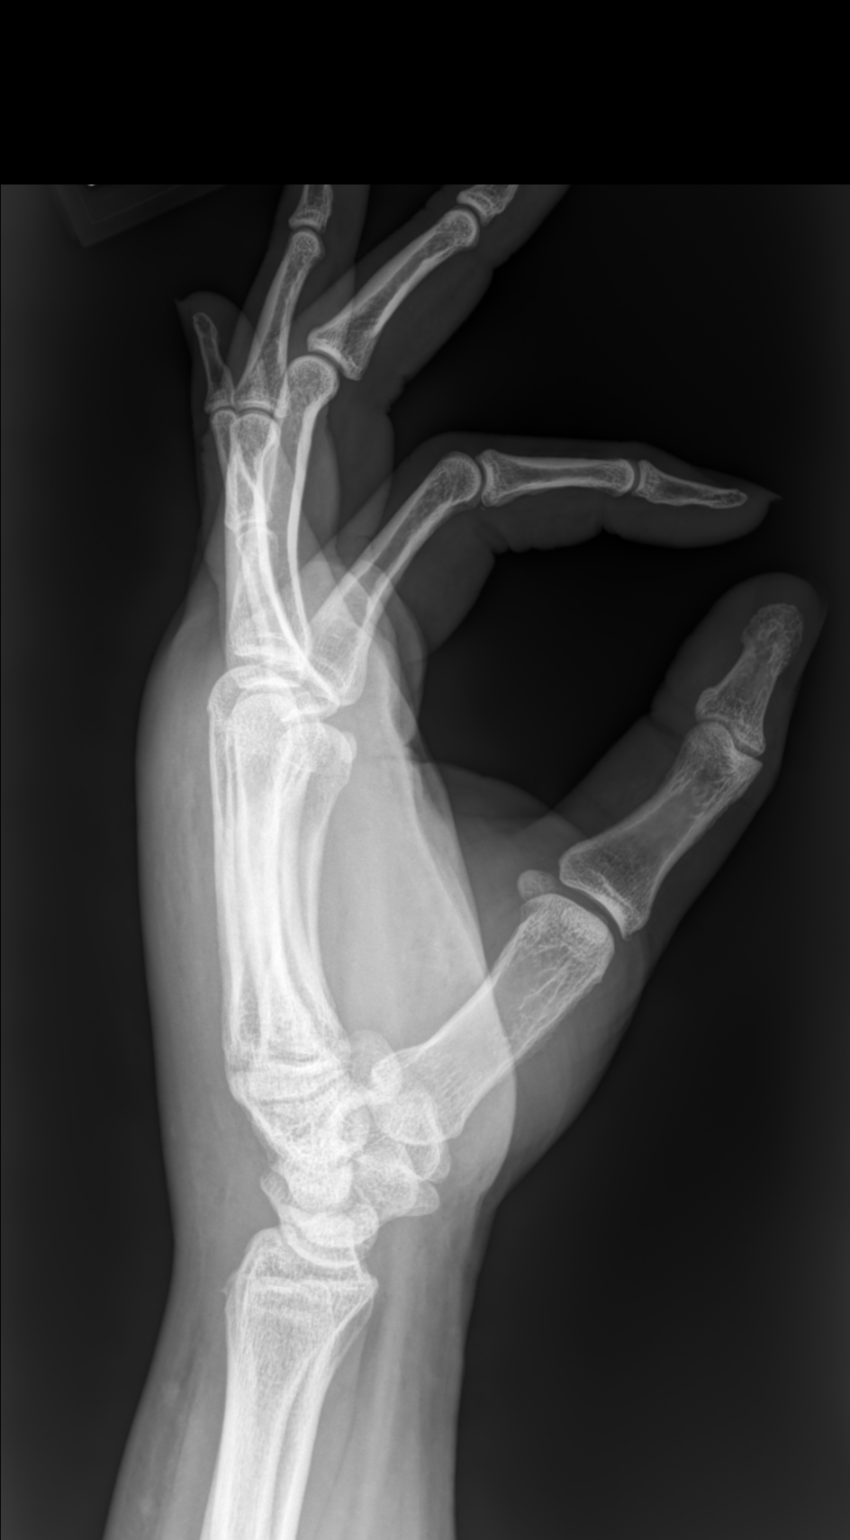

[3 of 3 positions shown; findings below may reference images not displayed]

FINDINGS: No acute fracture or dislocation. The bones are osteopenic. There is
diffuse soft tissue swelling of the dorsum of the hand. No
radiopaque foreign object or soft tissue gas.
IMPRESSION: 1. No acute fracture or dislocation.
2. Soft tissue swelling of the hand.

## 2024-06-23 ENCOUNTER — Encounter (HOSPITAL_COMMUNITY): Payer: Self-pay

## 2024-06-23 ENCOUNTER — Ambulatory Visit (HOSPITAL_COMMUNITY)
Admission: EM | Admit: 2024-06-23 | Discharge: 2024-06-23 | Disposition: A | Discharge status: Home / Self Care | Attending: Physician Assistant | Admitting: Physician Assistant

## 2024-06-23 DIAGNOSIS — R030 Elevated blood-pressure reading, without diagnosis of hypertension: Secondary | ICD-10-CM

## 2024-06-23 DIAGNOSIS — G44019 Episodic cluster headache, not intractable: Secondary | ICD-10-CM

## 2024-06-23 MED ORDER — ACETAMINOPHEN 325 MG PO TABS
650.0000 mg | ORAL_TABLET | Freq: Once | ORAL | Status: AC
  Administered 2024-06-23: 650 mg via ORAL

## 2024-06-23 MED ORDER — ACETAMINOPHEN 325 MG PO TABS
ORAL_TABLET | ORAL | Status: AC
  Filled 2024-06-23: qty 2

## 2024-06-23 NOTE — ED Triage Notes (Signed)
 Pt c/o headaches for the past 2 months. States had COVID exposure in July and headaches started afterwards. Denies taking meds for headaches. Denies blurred vision or sensitivity to light.

## 2024-06-23 NOTE — Discharge Instructions (Addendum)
 VISIT SUMMARY:  You came in today because you have been experiencing frequent headaches. These headaches usually start behind one of your eyes and spread around your head, sometimes causing watery eyes and a feeling of pressure. You also mentioned that your blood pressure has been higher than usual since you took weight loss pills in the past.  YOUR PLAN:  -CLUSTER HEADACHES: Cluster headaches are severe headaches that occur on one side of the head, often around the eye, and can cause watery eyes and a runny nose. For relief, you can take Tylenol  or ibuprofen. If you experience severe symptoms like vision changes, nausea, vomiting, or loss of consciousness, seek emergency care immediately. If the pressure sensation behind your eye continues, follow up with your primary care provider. If your symptoms get worse, you  may need a referral to a neurologist.  -ELEVATED BLOOD PRESSURE: Your blood pressure is slightly higher than normal, which might be due to pain or nervousness. You should monitor your blood pressure at home once daily and keep a record of the readings. Discuss these readings with your primary care provider during your follow-up visit.  INSTRUCTIONS:  Please monitor your blood pressure at home once daily and keep a record of the readings. Follow up with your primary care provider to discuss your blood pressure pattern and the pressure sensation behind your eye. If you experience severe symptoms like vision changes, nausea, vomiting, or loss of consciousness, seek emergency care immediately.

## 2024-06-23 NOTE — ED Provider Notes (Signed)
 [facility]    CSN: [id number] Arrival date & time: 06/23/24  1502      History   Chief Complaint Chief Complaint  Patient presents with   Headache    HPI Favor Hackler is a 53 y.o. male.  has a past medical history of Achilles tendon rupture.   HPI  Discussed the use of AI scribe software for clinical note transcription with the patient, who gave verbal consent to proceed.  The patient presents with frequent headaches.  The patient experiences headaches more frequently than usual, with recent occurrences last week and yesterday. The headaches typically start behind the right eye and sometimes the left, with a sensation of pressure and sensitivity around the head. The pain spreads around the head, with a current pain level of about 1.5, but can reach up to 4 or 5 when active.  There is no sensitivity to light, but there is a feeling of pressure behind the right eye. No vision changes are reported. He describes a sensation akin to a 'mini blackout' or brief disorientation, but denies dizziness or outright loss of consciousness. There is no numbness, tingling, or weakness, except for occasional sciatic nerve discomfort when sitting, which has mostly resolved.  There is no nausea, vomiting, facial drooping, or difficulty speaking. Headaches worsen with physical exertion, leading him to avoid the gym during episodes. The headaches are sometimes accompanied by watery eyes, primarily affecting the right eye, but occasionally both.  He has a history of elevated blood pressure readings, attributed to past use of weight loss pills. Blood pressure has been higher than his usual baseline of 118/70 since then.   Past Medical History:  Diagnosis Date   Achilles tendon rupture    right    Patient Active Problem List   Diagnosis Date Noted   Bilateral knee pain 04/23/2014   Right elbow pain 04/23/2014   Left shoulder pain 04/23/2014   OBESITY, UNSPECIFIED 12/09/2009    PALPITATIONS 12/09/2009   ABNORMAL ELECTROCARDIOGRAM 12/09/2009    Past Surgical History:  Procedure Laterality Date   ACHILLES TENDON SURGERY Right 02/10/2016   Procedure: ACHILLES TENDON REPAIR;  Surgeon: Maude Herald, MD;  Location: [facility];  Service: Orthopedics;  Laterality: Right;   FINGER AMPUTATION     Tip of Right middle finger   WISDOM TOOTH EXTRACTION         Home Medications    Prior to Admission medications   Medication Sig Start Date End Date Taking? Authorizing Provider  cholecalciferol (VITAMIN D) 1000 units tablet Take 1,000 Units by mouth daily.    [provider]  glucosamine-chondroitin 500-400 MG tablet Take 1 tablet by mouth 3 (three) times daily.    [provider]  Multiple Vitamin (MULTIVITAMIN) capsule Take 1 capsule by mouth daily.    [provider]  naproxen  (NAPROSYN ) 500 MG tablet Take 1 tablet (500 mg total) by mouth 2 (two) times daily with a meal. 01/07/18   Starla, [place] D, PA-C  Omega-3 Fatty Acids (FISH OIL) 1000 MG CAPS Take by mouth.    [provider]    Family History Family History  Problem Relation Age of Onset   Hypertension Father    Hypertension Brother    Diabetes Mother    Arrhythmia Neg Hx    Heart disease Neg Hx        No early   Cardiomyopathy Neg Hx        Nor sudden cardiac death    Social  History Social History   Tobacco Use   Smoking status: Never   Smokeless tobacco: Never  Vaping Use   Vaping status: Never Used  Substance Use Topics   Alcohol use: Yes    Alcohol/week: 0.0 standard drinks of alcohol    Comment: Rarely   Drug use: Never     Allergies   Mold extract [trichophyton]   Review of Systems Review of Systems  Constitutional:  Negative for chills and fever.  Eyes:  Negative for photophobia and visual disturbance.  Gastrointestinal:  Negative for nausea and vomiting.  Neurological:  Positive for headaches. Negative for dizziness,  syncope, facial asymmetry, speech difficulty, weakness, light-headedness and numbness.  Psychiatric/Behavioral:  Negative for confusion.      Physical Exam Triage Vital Signs ED Triage Vitals  Encounter Vitals Group     BP 06/23/24 1553 (!) 158/96     Girls Systolic BP Percentile --      Girls Diastolic BP Percentile --      Boys Systolic BP Percentile --      Boys Diastolic BP Percentile --      Pulse Rate 06/23/24 1553 69     Resp 06/23/24 1553 18     Temp 06/23/24 1553 98.7 F (37.1 C)     Temp Source 06/23/24 1553 Oral     SpO2 06/23/24 1553 95 %     Weight --      Height --      Head Circumference --      Peak Flow --      Pain Score 06/23/24 1550 1     Pain Loc --      Pain Education --      Exclude from Growth Chart --    No data found.  Updated Vital Signs BP (!) 158/96 (BP Location: Left Arm)   Pulse 69   Temp 98.7 F (37.1 C) (Oral)   Resp 18   SpO2 95%   Visual Acuity Right Eye Distance:   Left Eye Distance:   Bilateral Distance:    Right Eye Near:   Left Eye Near:    Bilateral Near:     Physical Exam Vitals reviewed.  Constitutional:      General: He is awake.     Appearance: Normal appearance. He is well-developed and well-groomed.  HENT:     Head: Normocephalic and atraumatic.  Eyes:     Extraocular Movements: Extraocular movements intact.     Conjunctiva/sclera: Conjunctivae normal.  Cardiovascular:     Rate and Rhythm: Normal rate and regular rhythm.     Pulses:          Radial pulses are 2+ on the right side and 2+ on the left side.     Heart sounds: Normal heart sounds. No murmur heard.    No friction rub. No gallop.  Pulmonary:     Effort: Pulmonary effort is normal.     Breath sounds: Normal breath sounds. No decreased air movement. No decreased breath sounds, wheezing, rhonchi or rales.  Musculoskeletal:     Cervical back: Normal range of motion.  Neurological:     Mental Status: He is alert and oriented to person, place, and  time.     Cranial Nerves: No cranial nerve deficit, dysarthria or facial asymmetry.     Motor: No weakness, tremor, atrophy or abnormal muscle tone.     Gait: Gait is intact.     Comments: Comments: MENTAL STATUS: AAOx3, memory intact, fund of knowledge  appropriate   LANG/SPEECH: Naming and repetition intact, fluent, no dysarthria, follows 3-step commands, answers questions appropriately     CRANIAL NERVES:   II: Pupils equal and reactive, no RAPD   III, IV, VI: EOM intact, no gaze preference or deviation, no nystagmus.   V: normal sensation in V1, V2, and V3 segments bilaterally   VII: no asymmetry, no nasolabial fold flattening   VIII: normal hearing to speech   IX, X: normal palatal elevation, no uvular deviation   XI: 5/5 head turn and 5/5 shoulder shrug bilaterally   XII: midline tongue protrusion   MOTOR:  5/5 bilateral grip strength 5/5 strength dorsiflexion/plantarflexion b/l  COORD: Normal heel to shin, no tremor, no dysmetria   STATION: normal stance, no truncal ataxia   GAIT: Normal; patient able to tip-toe, heel-walk.   Psychiatric:        Attention and Perception: Attention normal.        Mood and Affect: Mood normal.        Speech: Speech normal.        Behavior: Behavior normal. Behavior is cooperative.      UC Treatments / Results  Labs (all labs ordered are listed, but only abnormal results are displayed) Labs Reviewed - No data to display  EKG   Radiology No results found.  Procedures Procedures (including critical care time)  Medications Ordered in UC Medications  acetaminophen  (TYLENOL ) tablet 650 mg (650 mg Oral Given 06/23/24 1642)    Initial Impression / Assessment and Plan / UC Course  I have reviewed the triage vital signs and the nursing notes.  Pertinent labs & imaging results that were available during my care of the patient were reviewed by me and considered in my medical decision making (see chart for details).      Final  Clinical Impressions(s) / UC Diagnoses   Final diagnoses:  Episodic cluster headache, not intractable  Elevated blood pressure reading without diagnosis of hypertension   Cluster headaches Recurrent headaches with increased frequency, starting behind the right or left eye, spreading around the head. Severity ranges from 1.5/10 at rest to 4-5/10. Associated with right-sided watery eyes and pressure sensation behind the eye. No vision changes, nausea, vomiting, or loss of consciousness. Symptoms suggestive of cluster headaches, characterized by unilateral pain, eye watering, and potential runny nose. Neurological examination is normal. - Administer Tylenol  or ibuprofen for headache relief - Advise monitoring for severe symptoms such as vision changes, nausea, vomiting, or loss of consciousness and seek emergency care if he occurs - Recommend follow-up with primary care provider if pressure sensation persists after eye exam - Consider referral to neurology if symptoms worsen  Elevated blood pressure Slightly elevated blood pressure, potentially due to pain or nervousness. No consistent history of hypertension, but reports fluctuations since taking weight loss pills. Historically, blood pressure was around 118/70 mmHg. - Advise home monitoring of blood pressure once daily and record readings - Discuss blood pressure pattern with primary care provider during follow-up     Discharge Instructions      VISIT SUMMARY:  You came in today because you have been experiencing frequent headaches. These headaches usually start behind one of your eyes and spread around your head, sometimes causing watery eyes and a feeling of pressure. You also mentioned that your blood pressure has been higher than usual since you took weight loss pills in the past.  YOUR PLAN:  -CLUSTER HEADACHES: Cluster headaches are severe headaches that occur on one side of  the head, often around the eye, and can cause watery eyes  and a runny nose. For relief, you can take Tylenol  or ibuprofen. If you experience severe symptoms like vision changes, nausea, vomiting, or loss of consciousness, seek emergency care immediately. If the pressure sensation behind your eye continues, follow up with your primary care provider. If your symptoms get worse, you  may need a referral to a neurologist.  -ELEVATED BLOOD PRESSURE: Your blood pressure is slightly higher than normal, which might be due to pain or nervousness. You should monitor your blood pressure at home once daily and keep a record of the readings. Discuss these readings with your primary care provider during your follow-up visit.  INSTRUCTIONS:  Please monitor your blood pressure at home once daily and keep a record of the readings. Follow up with your primary care provider to discuss your blood pressure pattern and the pressure sensation behind your eye. If you experience severe symptoms like vision changes, nausea, vomiting, or loss of consciousness, seek emergency care immediately.     ED Prescriptions   None    PDMP not reviewed this encounter.   Marylene Rocky BRAVO, PA-C 06/23/24 1728
